# Patient Record
Sex: Male | Born: 1945 | Race: White | Hispanic: No | Smoking: Former smoker
Health system: Southern US, Community
[De-identification: ages and names within clinical notes are randomized; demographics above are authoritative.]

## PROBLEM LIST (undated history)

## (undated) DIAGNOSIS — I10 Essential (primary) hypertension: Secondary | ICD-10-CM

## (undated) DIAGNOSIS — Z7901 Long term (current) use of anticoagulants: Secondary | ICD-10-CM

## (undated) DIAGNOSIS — N289 Disorder of kidney and ureter, unspecified: Secondary | ICD-10-CM

## (undated) DIAGNOSIS — C801 Malignant (primary) neoplasm, unspecified: Secondary | ICD-10-CM

## (undated) HISTORY — PX: OTHER SURGICAL HISTORY: SHX169

## (undated) HISTORY — PX: HIP SURGERY: SHX245

---

## 2015-04-23 ENCOUNTER — Emergency Department (HOSPITAL_COMMUNITY): Payer: Medicare Other | Admitting: Anesthesiology

## 2015-04-23 ENCOUNTER — Encounter (HOSPITAL_COMMUNITY): Payer: Self-pay | Admitting: Emergency Medicine

## 2015-04-23 ENCOUNTER — Emergency Department (HOSPITAL_COMMUNITY): Payer: Medicare Other

## 2015-04-23 ENCOUNTER — Inpatient Hospital Stay (HOSPITAL_COMMUNITY)
Admission: EM | Admit: 2015-04-23 | Discharge: 2015-05-24 | DRG: 025 | Disposition: E | Payer: Medicare Other | Attending: General Surgery | Admitting: General Surgery

## 2015-04-23 ENCOUNTER — Encounter (HOSPITAL_COMMUNITY): Admission: EM | Disposition: E | Payer: Self-pay | Source: Home / Self Care

## 2015-04-23 DIAGNOSIS — Z515 Encounter for palliative care: Secondary | ICD-10-CM | POA: Diagnosis present

## 2015-04-23 DIAGNOSIS — G935 Compression of brain: Secondary | ICD-10-CM | POA: Diagnosis present

## 2015-04-23 DIAGNOSIS — Z791 Long term (current) use of non-steroidal anti-inflammatories (NSAID): Secondary | ICD-10-CM

## 2015-04-23 DIAGNOSIS — S0292XA Unspecified fracture of facial bones, initial encounter for closed fracture: Secondary | ICD-10-CM | POA: Diagnosis present

## 2015-04-23 DIAGNOSIS — G936 Cerebral edema: Secondary | ICD-10-CM | POA: Diagnosis not present

## 2015-04-23 DIAGNOSIS — R402122 Coma scale, eyes open, to pain, at arrival to emergency department: Secondary | ICD-10-CM | POA: Diagnosis present

## 2015-04-23 DIAGNOSIS — S0281XA Fracture of other specified skull and facial bones, right side, initial encounter for closed fracture: Secondary | ICD-10-CM | POA: Diagnosis present

## 2015-04-23 DIAGNOSIS — S065X3A Traumatic subdural hemorrhage with loss of consciousness of 1 hour to 5 hours 59 minutes, initial encounter: Principal | ICD-10-CM | POA: Diagnosis present

## 2015-04-23 DIAGNOSIS — S2232XA Fracture of one rib, left side, initial encounter for closed fracture: Secondary | ICD-10-CM | POA: Diagnosis present

## 2015-04-23 DIAGNOSIS — I4891 Unspecified atrial fibrillation: Secondary | ICD-10-CM | POA: Diagnosis not present

## 2015-04-23 DIAGNOSIS — Z4659 Encounter for fitting and adjustment of other gastrointestinal appliance and device: Secondary | ICD-10-CM

## 2015-04-23 DIAGNOSIS — R402342 Coma scale, best motor response, flexion withdrawal, at arrival to emergency department: Secondary | ICD-10-CM | POA: Diagnosis present

## 2015-04-23 DIAGNOSIS — Z87891 Personal history of nicotine dependence: Secondary | ICD-10-CM

## 2015-04-23 DIAGNOSIS — S066X9A Traumatic subarachnoid hemorrhage with loss of consciousness of unspecified duration, initial encounter: Secondary | ICD-10-CM | POA: Diagnosis present

## 2015-04-23 DIAGNOSIS — I2510 Atherosclerotic heart disease of native coronary artery without angina pectoris: Secondary | ICD-10-CM | POA: Diagnosis present

## 2015-04-23 DIAGNOSIS — D62 Acute posthemorrhagic anemia: Secondary | ICD-10-CM | POA: Diagnosis not present

## 2015-04-23 DIAGNOSIS — W1789XA Other fall from one level to another, initial encounter: Secondary | ICD-10-CM | POA: Diagnosis present

## 2015-04-23 DIAGNOSIS — R402222 Coma scale, best verbal response, incomprehensible words, at arrival to emergency department: Secondary | ICD-10-CM | POA: Diagnosis present

## 2015-04-23 DIAGNOSIS — E873 Alkalosis: Secondary | ICD-10-CM | POA: Diagnosis present

## 2015-04-23 DIAGNOSIS — J9690 Respiratory failure, unspecified, unspecified whether with hypoxia or hypercapnia: Secondary | ICD-10-CM

## 2015-04-23 DIAGNOSIS — W19XXXA Unspecified fall, initial encounter: Secondary | ICD-10-CM | POA: Diagnosis present

## 2015-04-23 DIAGNOSIS — J9600 Acute respiratory failure, unspecified whether with hypoxia or hypercapnia: Secondary | ICD-10-CM | POA: Diagnosis present

## 2015-04-23 DIAGNOSIS — S0240CA Maxillary fracture, right side, initial encounter for closed fracture: Secondary | ICD-10-CM | POA: Diagnosis present

## 2015-04-23 DIAGNOSIS — Z8546 Personal history of malignant neoplasm of prostate: Secondary | ICD-10-CM

## 2015-04-23 DIAGNOSIS — S065XAA Traumatic subdural hemorrhage with loss of consciousness status unknown, initial encounter: Secondary | ICD-10-CM | POA: Diagnosis present

## 2015-04-23 DIAGNOSIS — Z955 Presence of coronary angioplasty implant and graft: Secondary | ICD-10-CM

## 2015-04-23 DIAGNOSIS — I10 Essential (primary) hypertension: Secondary | ICD-10-CM | POA: Diagnosis present

## 2015-04-23 DIAGNOSIS — T1490XA Injury, unspecified, initial encounter: Secondary | ICD-10-CM

## 2015-04-23 DIAGNOSIS — S299XXA Unspecified injury of thorax, initial encounter: Secondary | ICD-10-CM

## 2015-04-23 DIAGNOSIS — Z66 Do not resuscitate: Secondary | ICD-10-CM | POA: Diagnosis present

## 2015-04-23 DIAGNOSIS — J189 Pneumonia, unspecified organism: Secondary | ICD-10-CM

## 2015-04-23 DIAGNOSIS — I6200 Nontraumatic subdural hemorrhage, unspecified: Secondary | ICD-10-CM | POA: Diagnosis not present

## 2015-04-23 DIAGNOSIS — S0240EA Zygomatic fracture, right side, initial encounter for closed fracture: Secondary | ICD-10-CM | POA: Diagnosis present

## 2015-04-23 DIAGNOSIS — T1490 Injury, unspecified: Secondary | ICD-10-CM | POA: Diagnosis present

## 2015-04-23 DIAGNOSIS — S065X9A Traumatic subdural hemorrhage with loss of consciousness of unspecified duration, initial encounter: Secondary | ICD-10-CM | POA: Diagnosis present

## 2015-04-23 DIAGNOSIS — S066XAA Traumatic subarachnoid hemorrhage with loss of consciousness status unknown, initial encounter: Secondary | ICD-10-CM | POA: Diagnosis present

## 2015-04-23 HISTORY — DX: Malignant (primary) neoplasm, unspecified: C80.1

## 2015-04-23 HISTORY — PX: CRANIOTOMY: SHX93

## 2015-04-23 HISTORY — DX: Disorder of kidney and ureter, unspecified: N28.9

## 2015-04-23 HISTORY — DX: Long term (current) use of anticoagulants: Z79.01

## 2015-04-23 HISTORY — DX: Essential (primary) hypertension: I10

## 2015-04-23 LAB — PROTIME-INR
INR: 1.08 (ref 0.00–1.49)
INR: 1.15 (ref 0.00–1.49)
Prothrombin Time: 14.2 seconds (ref 11.6–15.2)
Prothrombin Time: 14.9 seconds (ref 11.6–15.2)

## 2015-04-23 LAB — POCT I-STAT 3, ART BLOOD GAS (G3+)
Acid-Base Excess: 3 mmol/L — ABNORMAL HIGH (ref 0.0–2.0)
BICARBONATE: 26.2 meq/L — AB (ref 20.0–24.0)
O2 SAT: 99 %
TCO2: 27 mmol/L (ref 0–100)
pCO2 arterial: 35.9 mmHg (ref 35.0–45.0)
pH, Arterial: 7.47 — ABNORMAL HIGH (ref 7.350–7.450)
pO2, Arterial: 150 mmHg — ABNORMAL HIGH (ref 80.0–100.0)

## 2015-04-23 LAB — PREPARE FRESH FROZEN PLASMA
UNIT DIVISION: 0
Unit division: 0

## 2015-04-23 LAB — COMPREHENSIVE METABOLIC PANEL
ALK PHOS: 66 U/L (ref 38–126)
ALT: 11 U/L — AB (ref 17–63)
AST: 39 U/L (ref 15–41)
Albumin: 4 g/dL (ref 3.5–5.0)
Anion gap: 9 (ref 5–15)
BUN: 26 mg/dL — AB (ref 6–20)
CALCIUM: 8.9 mg/dL (ref 8.9–10.3)
CHLORIDE: 100 mmol/L — AB (ref 101–111)
CO2: 29 mmol/L (ref 22–32)
CREATININE: 1.89 mg/dL — AB (ref 0.61–1.24)
GFR, EST AFRICAN AMERICAN: 40 mL/min — AB (ref >60)
GFR, EST NON AFRICAN AMERICAN: 34 mL/min — AB (ref >60)
Glucose, Bld: 110 mg/dL — ABNORMAL HIGH (ref 65–99)
Potassium: 5.5 mmol/L — ABNORMAL HIGH (ref 3.5–5.1)
Sodium: 138 mmol/L (ref 135–145)
Total Bilirubin: 1.8 mg/dL — ABNORMAL HIGH (ref 0.3–1.2)
Total Protein: 6.4 g/dL — ABNORMAL LOW (ref 6.5–8.1)

## 2015-04-23 LAB — CBC
HCT: 43.3 % (ref 39.0–52.0)
HEMATOCRIT: 34.7 % — AB (ref 39.0–52.0)
HEMOGLOBIN: 11.3 g/dL — AB (ref 13.0–17.0)
Hemoglobin: 14 g/dL (ref 13.0–17.0)
MCH: 28.3 pg (ref 26.0–34.0)
MCH: 28 pg (ref 26.0–34.0)
MCHC: 32.3 g/dL (ref 30.0–36.0)
MCHC: 32.6 g/dL (ref 30.0–36.0)
MCV: 87.5 fL (ref 78.0–100.0)
MCV: 86.1 fL (ref 78.0–100.0)
PLATELETS: 218 10*3/uL (ref 150–400)
Platelets: 162 10*3/uL (ref 150–400)
RBC: 4.95 MIL/uL (ref 4.22–5.81)
RBC: 4.03 MIL/uL — AB (ref 4.22–5.81)
RDW: 15.1 % (ref 11.5–15.5)
RDW: 14.7 % (ref 11.5–15.5)
WBC: 11.8 10*3/uL — AB (ref 4.0–10.5)
WBC: 12.9 10*3/uL — AB (ref 4.0–10.5)

## 2015-04-23 LAB — POCT I-STAT 7, (LYTES, BLD GAS, ICA,H+H)
Acid-Base Excess: 3 mmol/L — ABNORMAL HIGH (ref 0.0–2.0)
BICARBONATE: 26.8 meq/L — AB (ref 20.0–24.0)
CALCIUM ION: 0.99 mmol/L — AB (ref 1.13–1.30)
HEMATOCRIT: 36 % — AB (ref 39.0–52.0)
Hemoglobin: 12.2 g/dL — ABNORMAL LOW (ref 13.0–17.0)
O2 SAT: 100 %
PH ART: 7.48 — AB (ref 7.350–7.450)
POTASSIUM: 3.1 mmol/L — AB (ref 3.5–5.1)
SODIUM: 139 mmol/L (ref 135–145)
TCO2: 28 mmol/L (ref 0–100)
pCO2 arterial: 36.1 mmHg (ref 35.0–45.0)
pO2, Arterial: 493 mmHg — ABNORMAL HIGH (ref 80.0–100.0)

## 2015-04-23 LAB — BASIC METABOLIC PANEL
ANION GAP: 12 (ref 5–15)
BUN: 24 mg/dL — ABNORMAL HIGH (ref 6–20)
CHLORIDE: 100 mmol/L — AB (ref 101–111)
CO2: 23 mmol/L (ref 22–32)
Calcium: 8.1 mg/dL — ABNORMAL LOW (ref 8.9–10.3)
Creatinine, Ser: 1.56 mg/dL — ABNORMAL HIGH (ref 0.61–1.24)
GFR calc non Af Amer: 43 mL/min — ABNORMAL LOW (ref >60)
GFR, EST AFRICAN AMERICAN: 50 mL/min — AB (ref >60)
GLUCOSE: 181 mg/dL — AB (ref 65–99)
POTASSIUM: 3.4 mmol/L — AB (ref 3.5–5.1)
Sodium: 135 mmol/L (ref 135–145)

## 2015-04-23 LAB — I-STAT CHEM 8, ED
BUN: 42 mg/dL — AB (ref 6–20)
CREATININE: 1.8 mg/dL — AB (ref 0.61–1.24)
Calcium, Ion: 1.06 mmol/L — ABNORMAL LOW (ref 1.13–1.30)
Chloride: 100 mmol/L — ABNORMAL LOW (ref 101–111)
GLUCOSE: 110 mg/dL — AB (ref 65–99)
HEMATOCRIT: 47 % (ref 39.0–52.0)
Hemoglobin: 16 g/dL (ref 13.0–17.0)
POTASSIUM: 5 mmol/L (ref 3.5–5.1)
Sodium: 139 mmol/L (ref 135–145)
TCO2: 30 mmol/L (ref 0–100)

## 2015-04-23 LAB — ETHANOL

## 2015-04-23 LAB — PREPARE RBC (CROSSMATCH)

## 2015-04-23 LAB — I-STAT ARTERIAL BLOOD GAS, ED
Acid-Base Excess: 6 mmol/L — ABNORMAL HIGH (ref 0.0–2.0)
Bicarbonate: 30.7 mEq/L — ABNORMAL HIGH (ref 20.0–24.0)
O2 SAT: 100 %
PCO2 ART: 44.3 mmHg (ref 35.0–45.0)
PH ART: 7.45 (ref 7.350–7.450)
PO2 ART: 510 mmHg — AB (ref 80.0–100.0)
TCO2: 32 mmol/L (ref 0–100)

## 2015-04-23 LAB — APTT: aPTT: 29 seconds (ref 24–37)

## 2015-04-23 LAB — SAMPLE TO BLOOD BANK

## 2015-04-23 LAB — MRSA PCR SCREENING: MRSA BY PCR: NEGATIVE

## 2015-04-23 LAB — TRIGLYCERIDES: TRIGLYCERIDES: 52 mg/dL (ref <150)

## 2015-04-23 LAB — ABO/RH: ABO/RH(D): O POS

## 2015-04-23 SURGERY — CRANIOTOMY HEMATOMA EVACUATION SUBDURAL
Anesthesia: General | Site: Head | Laterality: Left

## 2015-04-23 MED ORDER — ROCURONIUM BROMIDE 50 MG/5ML IV SOLN
INTRAVENOUS | Status: AC
  Filled 2015-04-23: qty 2

## 2015-04-23 MED ORDER — 0.9 % SODIUM CHLORIDE (POUR BTL) OPTIME
TOPICAL | Status: DC | PRN
  Administered 2015-04-23 (×2): 1000 mL

## 2015-04-23 MED ORDER — POTASSIUM CHLORIDE IN NACL 20-0.9 MEQ/L-% IV SOLN
INTRAVENOUS | Status: DC
  Administered 2015-04-23 – 2015-04-27 (×4): via INTRAVENOUS
  Filled 2015-04-23 (×8): qty 1000

## 2015-04-23 MED ORDER — MANNITOL 25 % IV SOLN
25.0000 g | Freq: Once | INTRAVENOUS | Status: AC
  Administered 2015-04-23: 25 g via INTRAVENOUS
  Filled 2015-04-23: qty 100

## 2015-04-23 MED ORDER — MIDAZOLAM HCL 5 MG/5ML IJ SOLN
INTRAMUSCULAR | Status: AC | PRN
  Administered 2015-04-23: 2 mg via INTRAVENOUS

## 2015-04-23 MED ORDER — PANTOPRAZOLE SODIUM 40 MG IV SOLR
40.0000 mg | Freq: Every day | INTRAVENOUS | Status: DC
  Administered 2015-04-23 – 2015-04-27 (×5): 40 mg via INTRAVENOUS
  Filled 2015-04-23 (×5): qty 40

## 2015-04-23 MED ORDER — FENTANYL CITRATE (PF) 100 MCG/2ML IJ SOLN
INTRAMUSCULAR | Status: DC | PRN
  Administered 2015-04-23 (×3): 50 ug via INTRAVENOUS
  Administered 2015-04-23: 100 ug via INTRAVENOUS
  Administered 2015-04-23: 50 ug via INTRAVENOUS

## 2015-04-23 MED ORDER — ONDANSETRON HCL 4 MG PO TABS: 4.0000 mg | ORAL_TABLET | ORAL | Status: DC | PRN

## 2015-04-23 MED ORDER — PROMETHAZINE HCL 25 MG PO TABS: 12.5000 mg | ORAL_TABLET | ORAL | Status: DC | PRN

## 2015-04-23 MED ORDER — ANTISEPTIC ORAL RINSE SOLUTION (CORINZ)
7.0000 mL | Freq: Four times a day (QID) | OROMUCOSAL | Status: DC
Start: 2015-04-24 — End: 2015-04-23

## 2015-04-23 MED ORDER — FENTANYL CITRATE (PF) 100 MCG/2ML IJ SOLN: 50.0000 ug | Freq: Once | INTRAMUSCULAR | Status: DC

## 2015-04-23 MED ORDER — STERILE WATER FOR INJECTION IJ SOLN
INTRAMUSCULAR | Status: AC
  Filled 2015-04-23: qty 10

## 2015-04-23 MED ORDER — ONDANSETRON HCL 4 MG PO TABS: 4.0000 mg | ORAL_TABLET | Freq: Four times a day (QID) | ORAL | Status: DC | PRN

## 2015-04-23 MED ORDER — HEMOSTATIC AGENTS (NO CHARGE) OPTIME
TOPICAL | Status: DC | PRN
  Administered 2015-04-23: 1 via TOPICAL

## 2015-04-23 MED ORDER — CHLORHEXIDINE GLUCONATE 0.12% ORAL RINSE (MEDLINE KIT)
15.0000 mL | Freq: Two times a day (BID) | OROMUCOSAL | Status: DC
  Administered 2015-04-23: 15 mL via OROMUCOSAL

## 2015-04-23 MED ORDER — PANTOPRAZOLE SODIUM 40 MG IV SOLR: 40.0000 mg | Freq: Every day | INTRAVENOUS | Status: DC

## 2015-04-23 MED ORDER — THROMBIN 20000 UNITS EX SOLR
CUTANEOUS | Status: DC | PRN
  Administered 2015-04-23 (×2): 20 mL via TOPICAL

## 2015-04-23 MED ORDER — PHENYLEPHRINE HCL 10 MG/ML IJ SOLN
10.0000 mg | INTRAMUSCULAR | Status: DC | PRN
Start: 2015-04-23 — End: 2015-04-23
  Administered 2015-04-23: 20 ug/min via INTRAVENOUS

## 2015-04-23 MED ORDER — EPHEDRINE SULFATE 50 MG/ML IJ SOLN
INTRAMUSCULAR | Status: AC
  Filled 2015-04-23: qty 1

## 2015-04-23 MED ORDER — SODIUM CHLORIDE 0.9 % IV SOLN
175.0000 ug/h | INTRAVENOUS | Status: DC
  Administered 2015-04-23: 25 ug/h via INTRAVENOUS
  Administered 2015-04-25 (×2): 100 ug/h via INTRAVENOUS
  Administered 2015-04-27: 50 ug/h via INTRAVENOUS
  Filled 2015-04-23 (×3): qty 50

## 2015-04-23 MED ORDER — CEFAZOLIN SODIUM-DEXTROSE 2-3 GM-% IV SOLR
INTRAVENOUS | Status: AC
  Filled 2015-04-23: qty 50

## 2015-04-23 MED ORDER — PROPOFOL 1000 MG/100ML IV EMUL
INTRAVENOUS | Status: AC
Start: 2015-04-23 — End: 2015-04-23
  Filled 2015-04-23: qty 100

## 2015-04-23 MED ORDER — ALBUMIN HUMAN 5 % IV SOLN
INTRAVENOUS | Status: DC | PRN
  Administered 2015-04-23 (×2): via INTRAVENOUS

## 2015-04-23 MED ORDER — FENTANYL CITRATE (PF) 250 MCG/5ML IJ SOLN
INTRAMUSCULAR | Status: AC
  Filled 2015-04-23: qty 5

## 2015-04-23 MED ORDER — ROCURONIUM BROMIDE 100 MG/10ML IV SOLN
INTRAVENOUS | Status: DC | PRN
  Administered 2015-04-23: 50 mg via INTRAVENOUS
  Administered 2015-04-23: 20 mg via INTRAVENOUS

## 2015-04-23 MED ORDER — PROPOFOL 10 MG/ML IV BOLUS
INTRAVENOUS | Status: AC
  Filled 2015-04-23: qty 20

## 2015-04-23 MED ORDER — HYDROMORPHONE HCL 1 MG/ML IJ SOLN: 0.5000 mg | INTRAMUSCULAR | Status: DC | PRN

## 2015-04-23 MED ORDER — SODIUM CHLORIDE 0.9 % IV SOLN: INTRAVENOUS | Status: DC

## 2015-04-23 MED ORDER — SODIUM CHLORIDE 0.9 % IV SOLN
INTRAVENOUS | Status: DC | PRN
  Administered 2015-04-23: 19:00:00 via INTRAVENOUS

## 2015-04-23 MED ORDER — PROPOFOL 1000 MG/100ML IV EMUL
0.0000 ug/kg/min | INTRAVENOUS | Status: DC
Start: 2015-04-23 — End: 2015-04-27
  Administered 2015-04-23: 10 ug/kg/min via INTRAVENOUS
  Filled 2015-04-23: qty 100

## 2015-04-23 MED ORDER — CHLORHEXIDINE GLUCONATE 0.12% ORAL RINSE (MEDLINE KIT)
15.0000 mL | Freq: Two times a day (BID) | OROMUCOSAL | Status: DC
  Administered 2015-04-24 – 2015-04-27 (×7): 15 mL via OROMUCOSAL

## 2015-04-23 MED ORDER — BISACODYL 10 MG RE SUPP: 10.0000 mg | Freq: Every day | RECTAL | Status: DC | PRN

## 2015-04-23 MED ORDER — MIDAZOLAM HCL 2 MG/2ML IJ SOLN
INTRAMUSCULAR | Status: AC
  Filled 2015-04-23: qty 2

## 2015-04-23 MED ORDER — LABETALOL HCL 5 MG/ML IV SOLN: 10.0000 mg | INTRAVENOUS | Status: DC | PRN

## 2015-04-23 MED ORDER — DEXMEDETOMIDINE HCL IN NACL 200 MCG/50ML IV SOLN
INTRAVENOUS | Status: AC
  Filled 2015-04-23: qty 50

## 2015-04-23 MED ORDER — ANTISEPTIC ORAL RINSE SOLUTION (CORINZ)
7.0000 mL | Freq: Four times a day (QID) | OROMUCOSAL | Status: DC
  Administered 2015-04-24 – 2015-04-27 (×15): 7 mL via OROMUCOSAL

## 2015-04-23 MED ORDER — ONDANSETRON HCL 4 MG/2ML IJ SOLN: 4.0000 mg | INTRAMUSCULAR | Status: DC | PRN

## 2015-04-23 MED ORDER — SODIUM CHLORIDE 0.9 % IV SOLN
500.0000 mg | Freq: Two times a day (BID) | INTRAVENOUS | Status: DC
  Administered 2015-04-23 – 2015-04-27 (×8): 500 mg via INTRAVENOUS
  Filled 2015-04-23 (×10): qty 5

## 2015-04-23 MED ORDER — PANTOPRAZOLE SODIUM 40 MG PO TBEC: 40.0000 mg | DELAYED_RELEASE_TABLET | Freq: Every day | ORAL | Status: DC

## 2015-04-23 MED ORDER — CEFAZOLIN SODIUM-DEXTROSE 2-3 GM-% IV SOLR
2.0000 g | Freq: Three times a day (TID) | INTRAVENOUS | Status: AC
  Administered 2015-04-24 (×2): 2 g via INTRAVENOUS
  Filled 2015-04-23 (×2): qty 50

## 2015-04-23 MED ORDER — PROPOFOL 10 MG/ML IV BOLUS
0.5000 mg/kg | Freq: Once | INTRAVENOUS | Status: AC
  Administered 2015-04-23: 1000 mg via INTRAVENOUS

## 2015-04-23 MED ORDER — SODIUM CHLORIDE 0.9 % IV SOLN
INTRAVENOUS | Status: AC | PRN
  Administered 2015-04-23: 1000 mL via INTRAVENOUS

## 2015-04-23 MED ORDER — THROMBIN 5000 UNITS EX SOLR
OROMUCOSAL | Status: DC | PRN
  Administered 2015-04-23: 5 mL via TOPICAL

## 2015-04-23 MED ORDER — CEFAZOLIN SODIUM-DEXTROSE 2-3 GM-% IV SOLR
2.0000 g | INTRAVENOUS | Status: AC
  Administered 2015-04-23 (×2): 2 g via INTRAVENOUS

## 2015-04-23 MED ORDER — SODIUM CHLORIDE 0.9 % IV SOLN
INTRAVENOUS | Status: AC | PRN
Start: 2015-04-23 — End: 2015-04-23
  Administered 2015-04-23: 1000 mL via INTRAVENOUS

## 2015-04-23 MED ORDER — FENTANYL BOLUS VIA INFUSION
25.0000 ug | INTRAVENOUS | Status: DC | PRN
  Filled 2015-04-23: qty 25

## 2015-04-23 MED ORDER — ONDANSETRON HCL 4 MG/2ML IJ SOLN: 4.0000 mg | Freq: Four times a day (QID) | INTRAMUSCULAR | Status: DC | PRN

## 2015-04-23 MED ORDER — DEXMEDETOMIDINE HCL IN NACL 200 MCG/50ML IV SOLN
INTRAVENOUS | Status: DC | PRN
  Administered 2015-04-23: .2 ug/kg/h via INTRAVENOUS

## 2015-04-23 SURGICAL SUPPLY — 77 items
BAG DECANTER FOR FLEXI CONT (MISCELLANEOUS) ×3 IMPLANT
BANDAGE GAUZE 4  KLING STR (GAUZE/BANDAGES/DRESSINGS) IMPLANT
BIT DRILL WIRE PASS 1.3MM (BIT) ×1 IMPLANT
BLADE SURG 11 STRL SS (BLADE) IMPLANT
BNDG COHESIVE 4X5 TAN NS LF (GAUZE/BANDAGES/DRESSINGS) IMPLANT
BRUSH SCRUB EZ 1% IODOPHOR (MISCELLANEOUS) IMPLANT
BRUSH SCRUB EZ PLAIN DRY (MISCELLANEOUS) ×3 IMPLANT
BUR ACORN 6.0 PRECISION (BURR) ×2 IMPLANT
BUR ACORN 6.0MM PRECISION (BURR) ×1
BUR SPIRAL ROUTER 2.3 (BUR) IMPLANT
BUR SPIRAL ROUTER 2.3MM (BUR)
CANISTER SUCT 3000ML PPV (MISCELLANEOUS) ×3 IMPLANT
CLIP TI MEDIUM 6 (CLIP) IMPLANT
COVER BACK TABLE 60X90IN (DRAPES) ×6 IMPLANT
DERMABOND ADVANCED (GAUZE/BANDAGES/DRESSINGS) ×2
DERMABOND ADVANCED .7 DNX12 (GAUZE/BANDAGES/DRESSINGS) ×1 IMPLANT
DRAIN CHANNEL 10M FLAT 3/4 FLT (DRAIN) IMPLANT
DRAPE MICROSCOPE LEICA (MISCELLANEOUS) IMPLANT
DRAPE NEUROLOGICAL W/INCISE (DRAPES) ×3 IMPLANT
DRAPE SURG 17X23 STRL (DRAPES) IMPLANT
DRAPE WARM FLUID 44X44 (DRAPE) ×3 IMPLANT
DRILL WIRE PASS 1.3MM (BIT) ×3
ELECT CAUTERY BLADE 6.4 (BLADE) ×3 IMPLANT
ELECT REM PT RETURN 9FT ADLT (ELECTROSURGICAL) ×3
ELECTRODE REM PT RTRN 9FT ADLT (ELECTROSURGICAL) ×1 IMPLANT
EVACUATOR SILICONE 100CC (DRAIN) IMPLANT
GAUZE SPONGE 4X4 12PLY STRL (GAUZE/BANDAGES/DRESSINGS) ×6 IMPLANT
GAUZE SPONGE 4X4 16PLY XRAY LF (GAUZE/BANDAGES/DRESSINGS) IMPLANT
GLOVE BIO SURGEON STRL SZ 6.5 (GLOVE) ×2 IMPLANT
GLOVE BIO SURGEON STRL SZ7 (GLOVE) ×3 IMPLANT
GLOVE BIO SURGEON STRL SZ7.5 (GLOVE) ×3 IMPLANT
GLOVE BIO SURGEONS STRL SZ 6.5 (GLOVE) ×1
GLOVE ECLIPSE 6.5 STRL STRAW (GLOVE) ×3 IMPLANT
GLOVE ECLIPSE 8.5 STRL (GLOVE) ×3 IMPLANT
GLOVE ECLIPSE 9.0 STRL (GLOVE) ×3 IMPLANT
GLOVE EXAM NITRILE LRG STRL (GLOVE) IMPLANT
GLOVE EXAM NITRILE MD LF STRL (GLOVE) IMPLANT
GLOVE EXAM NITRILE XL STR (GLOVE) IMPLANT
GLOVE EXAM NITRILE XS STR PU (GLOVE) IMPLANT
GOWN STRL REUS W/ TWL LRG LVL3 (GOWN DISPOSABLE) IMPLANT
GOWN STRL REUS W/ TWL XL LVL3 (GOWN DISPOSABLE) IMPLANT
GOWN STRL REUS W/TWL 2XL LVL3 (GOWN DISPOSABLE) IMPLANT
GOWN STRL REUS W/TWL LRG LVL3 (GOWN DISPOSABLE)
GOWN STRL REUS W/TWL XL LVL3 (GOWN DISPOSABLE)
HEMOSTAT SURGICEL 2X14 (HEMOSTASIS) IMPLANT
KIT BASIN OR (CUSTOM PROCEDURE TRAY) ×3 IMPLANT
KIT ROOM TURNOVER OR (KITS) ×3 IMPLANT
LIQUID BAND (GAUZE/BANDAGES/DRESSINGS) IMPLANT
NEEDLE HYPO 25X1 1.5 SAFETY (NEEDLE) ×3 IMPLANT
NS IRRIG 1000ML POUR BTL (IV SOLUTION) ×3 IMPLANT
PACK CRANIOTOMY (CUSTOM PROCEDURE TRAY) ×3 IMPLANT
PAD ARMBOARD 7.5X6 YLW CONV (MISCELLANEOUS) ×3 IMPLANT
PAD EYE OVAL STERILE LF (GAUZE/BANDAGES/DRESSINGS) IMPLANT
PATTIES SURGICAL .25X.25 (GAUZE/BANDAGES/DRESSINGS) IMPLANT
PATTIES SURGICAL .5 X.5 (GAUZE/BANDAGES/DRESSINGS) IMPLANT
PATTIES SURGICAL .5 X3 (DISPOSABLE) IMPLANT
PATTIES SURGICAL 1X1 (DISPOSABLE) IMPLANT
PIN MAYFIELD SKULL DISP (PIN) IMPLANT
SPECIMEN JAR SMALL (MISCELLANEOUS) IMPLANT
SPONGE LAP 18X18 X RAY DECT (DISPOSABLE) IMPLANT
SPONGE NEURO XRAY DETECT 1X3 (DISPOSABLE) IMPLANT
SPONGE SURGIFOAM ABS GEL 100 (HEMOSTASIS) ×3 IMPLANT
STAPLER SKIN PROX WIDE 3.9 (STAPLE) ×3 IMPLANT
STAPLER VISISTAT 35W (STAPLE) ×3 IMPLANT
STOCKINETTE 6  STRL (DRAPES) ×2
STOCKINETTE 6 STRL (DRAPES) ×1 IMPLANT
STRIP SURGICAL 1/2 X 6 IN (GAUZE/BANDAGES/DRESSINGS) ×3 IMPLANT
SUT NURALON 4 0 TR CR/8 (SUTURE) ×3 IMPLANT
SUT VIC AB 2-0 CT2 18 VCP726D (SUTURE) ×12 IMPLANT
SUT VIC AB 3-0 SH 18 (SUTURE) ×3 IMPLANT
SYR CONTROL 10ML LL (SYRINGE) ×3 IMPLANT
TAPE CLOTH SURG 4X10 WHT LF (GAUZE/BANDAGES/DRESSINGS) ×6 IMPLANT
TOWEL OR 17X24 6PK STRL BLUE (TOWEL DISPOSABLE) ×3 IMPLANT
TOWEL OR 17X26 10 PK STRL BLUE (TOWEL DISPOSABLE) ×3 IMPLANT
TRAY FOLEY W/METER SILVER 14FR (SET/KITS/TRAYS/PACK) IMPLANT
UNDERPAD 30X30 INCONTINENT (UNDERPADS AND DIAPERS) IMPLANT
WATER STERILE IRR 1000ML POUR (IV SOLUTION) ×3 IMPLANT

## 2015-04-23 NOTE — Transfer of Care (Signed)
Immediate Anesthesia Transfer of Care Note  Patient: Timothy Oneill  Procedure(s) Performed: Procedure(s) with comments: CRANIECTOMY HEMATOMA EVACUATION SUBDURAL (Left) - Craniectomy for subdural hematoma evacuation. Placement of bone flap in abdomen.  Patient Location: ICU  Anesthesia Type:General  Level of Consciousness: sedated and unresponsive  Airway & Oxygen Therapy: Patient placed on Ventilator (see vital sign flow sheet for setting)  Post-op Assessment: Report given to RN  Post vital signs: Reviewed and stable  Last Vitals:  Filed Vitals:   05/11/2015 2039  BP: 167/90  Pulse: 91  Temp:   Resp: 16    Complications: No apparent anesthesia complications

## 2015-04-23 NOTE — Progress Notes (Signed)
   05/07/2015 1731  Clinical Encounter Type  Visited With Patient;Patient and family together;Family;Health care provider  Visit Type Initial;Code   Chaplain responded to a trauma in the ED, and helped family get to a consultation room, and sat with family during medical updating from doctor. Chaplain support available as needed.   Jeri Lager, Chaplain 05/11/2015 5:32 PM

## 2015-04-23 NOTE — ED Notes (Signed)
Pt transported to CT ?

## 2015-04-23 NOTE — Anesthesia Preprocedure Evaluation (Addendum)
Anesthesia Evaluation  Patient identified by MRN, date of birth, ID band Patient unresponsive    Reviewed: Patient's Chart, lab work & pertinent test results, Unable to perform ROS - Chart review onlyPreop documentation limited or incomplete due to emergent nature of procedure.  Airway Mallampati: Intubated      Comment: In hard C-collar Dental   Pulmonary  Intubated, ventilated L 1st rib fracture   breath sounds clear to auscultation       Cardiovascular + dysrhythmias (?anticoag?) Atrial Fibrillation  Rhythm:Irregular Rate:Normal  ??   Neuro/Psych Large acute left subdural hematoma involving the entire left cerebral convexity. 17 mm right midline shift.  Extensive subarachnoid hemorrhage in the right sylvian fissure and bilateral inferior temporal fossa. Small parenchymal hemorrhage left temporal lobe.    GI/Hepatic ??   Endo/Other    Renal/GU Renal InsufficiencyRenal disease (creat 1.80)     Musculoskeletal   Abdominal (+) + obese,   Peds  Hematology   Anesthesia Other Findings [place] off of back of truck, severe head injury: intubated, large SDH  Reproductive/Obstetrics                         Anesthesia Physical Anesthesia Plan  ASA: V and emergent  Anesthesia Plan: General   Post-op Pain Management:    Induction: Inhalational  Airway Management Planned: Oral ETT  Additional Equipment: Arterial line  Intra-op Plan:   Post-operative Plan: Post-operative intubation/ventilation  Informed Consent:   Only emergency history available  Plan Discussed with: CRNA and Surgeon  Anesthesia Plan Comments: (Plan routine monitors, A-line, GETA (existing ETT) with post op ventilation)        Anesthesia Quick Evaluation

## 2015-04-23 NOTE — Brief Op Note (Signed)
05/12/2015  8:23 PM  PATIENT:  Timothy Oneill  69 y.o. male  PRE-OPERATIVE DIAGNOSIS:  subdural hematoma  POST-OPERATIVE DIAGNOSIS:  subdural hematoma  PROCEDURE:  Procedure(s) with comments: CRANIECTOMY HEMATOMA EVACUATION SUBDURAL (Left) - Craniectomy for subdural hematoma evacuation. Placement of bone flap in abdomen.  SURGEON:  Surgeon(s) and Role:    * Earnie Larsson, MD - Primary  PHYSICIAN ASSISTANT:   ASSISTANTS:    ANESTHESIA:   general  EBL:  Total I/O In: 250 [IV Piggyback:250] Out: 950 [Urine:150; Blood:800]  BLOOD ADMINISTERED:none  DRAINS: (10 mm blake) Jackson-Pratt drain(s) with closed bulb suction in the subdural space   LOCAL MEDICATIONS USED:  NONE  SPECIMEN:  No Specimen  DISPOSITION OF SPECIMEN:  N/A  COUNTS:  YES  TOURNIQUET:  * No tourniquets in log *  DICTATION: .Dragon Dictation  PLAN OF CARE: Admit to inpatient   PATIENT DISPOSITION:  ICU - intubated and critically ill.   Delay start of Pharmacological VTE agent (>24hrs) due to surgical blood loss or risk of bleeding: yes

## 2015-04-23 NOTE — ED Notes (Signed)
Pharmacy called about manitol

## 2015-04-23 NOTE — ED Provider Notes (Signed)
CSN: [id number]     Arrival date & time 05/20/2015  1645 History   First MD Initiated Contact with Patient 05/14/2015 1654     Chief Complaint  Patient presents with  . Trauma    Patient is a 69 y.o. male presenting with trauma. The history is provided by the EMS personnel and medical records. The history is limited by the condition of the patient.  Trauma Mechanism of injury: fall Injury location: head/neck Injury location detail: head Incident location: outdoors Time since incident: 30 minutes Arrived directly from scene: yes   Fall:      Fall occurred: from a vehicle (pt witnessed falling off back of [employer] truck)      Impact surface: concrete  EMS/PTA data:      Ambulatory at scene: yes      Responsiveness at scene: initially GCS 15, reportedly communicative, but GCS declined en route now confused.      Airway interventions: endotracheal intubation      Reason for intubation: airway protection and respiratory support      Breathing interventions: assisted ventilation and oxygen      IV access: established      Fluids administered: normal saline      Cardiac interventions: none      Airway condition since incident: improving      Breathing condition since incident: improving  Relevant PMH:      Pharmacological risk factors:            Anticoagulation therapy (unknown ) and antiplatelet therapy (unknown).    Past Medical History  Diagnosis Date  . Renal disorder   . Hypertension   . Cancer [facility])     prostate  . Anticoagulated     Unknown.  According to document accompanying patient, the box is checked yes and states "take blood thinners"   Past Surgical History  Procedure Laterality Date  . Other surgical history      2 episodes of internal bleeding.   . Hip surgery    . Other surgical history      renal gland surgery   No family history on file. Social History  Substance Use Topics  . Smoking status: Former Smoker    Types: Cigarettes  . Smokeless tobacco:  None  . Alcohol Use: None    Review of Systems  Unable to perform ROS: Patient unresponsive  Skin: Positive for wound.    Allergies  Lisinopril  Home Medications   Prior to Admission medications   Not on File   BP 123/66 mmHg  Pulse 61  Temp(Src) 97.3 F (36.3 C)  Resp 20  Ht 6\' 2"  (1.88 m)  Wt 220 lb (99.791 kg)  BMI 28.23 kg/m2  SpO2 100% Physical Exam  Constitutional: He appears well-developed and well-nourished. No distress.  HENT:  Head: Normocephalic.  R periorbital edema with sparing of tarsal plate, large hematoma/mass over R parietal area of scalp, no skull depressions evident, no midface instability, no nasal septal deviation or hematoma  Eyes: Pupils are equal, round, and reactive to light. Right eye exhibits no discharge. Left eye exhibits no discharge.  Neck: No tracheal deviation present.  No obvious midline stepoff/deformity, C collar in place  Cardiovascular: Normal rate and regular rhythm.   Pulmonary/Chest: Effort normal and breath sounds normal. No respiratory distress.  Abdominal: Soft. He exhibits no distension.  Musculoskeletal: He exhibits no edema.  Extremities atraumatic. No midline thoracic or lumbar stepoffs/deformities   Neurological: GCS eye subscore is 1. GCS  verbal subscore is 2. GCS motor subscore is 3.  Skin: Skin is warm and dry.  Psychiatric: He is noncommunicative.    ED Course  INTUBATION Performed by: Ivin Booty Authorized by: Ivin Booty Consent: The procedure was performed in an emergent situation. Verbal consent not obtained. Patient identity confirmed: arm band and hospital-assigned identification number Time out: Immediately prior to procedure a "time out" was called to verify the correct patient, procedure, equipment, support staff and site/side marked as required. Indications: airway protection Intubation method: direct Patient status: paralyzed (RSI) Preoxygenation: nonrebreather mask Sedatives:  etomidate Paralytic: succinylcholine Laryngoscope size: Mac 3 Tube type: cuffed Number of attempts: 1 Cords visualized: yes Post-procedure assessment: chest rise,  ETCO2 monitor and CO2 detector Breath sounds: equal Cuff inflated: yes Tube secured with: ETT holder Chest x-ray interpreted by me, other physician and radiologist. Chest x-ray findings: endotracheal tube in appropriate position Patient tolerance: Patient tolerated the procedure well with no immediate complications   (including critical care time) Labs Review Labs Reviewed  COMPREHENSIVE METABOLIC PANEL - Abnormal; Notable for the following:    Potassium 5.5 (*)    Chloride 100 (*)    Glucose, Bld 110 (*)    BUN 26 (*)    Creatinine, Ser 1.89 (*)    Total Protein 6.4 (*)    ALT 11 (*)    Total Bilirubin 1.8 (*)    GFR calc non Af Amer 34 (*)    GFR calc Af Amer 40 (*)    All other components within normal limits  CBC - Abnormal; Notable for the following:    WBC 11.8 (*)    All other components within normal limits  CBC - Abnormal; Notable for the following:    WBC 12.9 (*)    RBC 4.03 (*)    Hemoglobin 11.3 (*)    HCT 34.7 (*)    All other components within normal limits  BASIC METABOLIC PANEL - Abnormal; Notable for the following:    Potassium 3.4 (*)    Chloride 100 (*)    Glucose, Bld 181 (*)    BUN 24 (*)    Creatinine, Ser 1.56 (*)    Calcium 8.1 (*)    GFR calc non Af Amer 43 (*)    GFR calc Af Amer 50 (*)    All other components within normal limits  I-STAT CHEM 8, ED - Abnormal; Notable for the following:    Chloride 100 (*)    BUN 42 (*)    Creatinine, Ser 1.80 (*)    Glucose, Bld 110 (*)    Calcium, Ion 1.06 (*)    All other components within normal limits  I-STAT ARTERIAL BLOOD GAS, ED - Abnormal; Notable for the following:    pO2, Arterial 510.0 (*)    Bicarbonate 30.7 (*)    Acid-Base Excess 6.0 (*)    All other components within normal limits  POCT I-STAT 7, (LYTES, BLD GAS,  ICA,H+H) - Abnormal; Notable for the following:    pH, Arterial 7.480 (*)    pO2, Arterial 493.0 (*)    Bicarbonate 26.8 (*)    Acid-Base Excess 3.0 (*)    Potassium 3.1 (*)    Calcium, Ion 0.99 (*)    HCT 36.0 (*)    Hemoglobin 12.2 (*)    All other components within normal limits  POCT I-STAT 3, ART BLOOD GAS (G3+) - Abnormal; Notable for the following:    pH, Arterial 7.470 (*)    pO2, Arterial  150.0 (*)    Bicarbonate 26.2 (*)    Acid-Base Excess 3.0 (*)    All other components within normal limits  MRSA PCR SCREENING  ETHANOL  PROTIME-INR  APTT  PROTIME-INR  CDS SEROLOGY  BLOOD GAS, ARTERIAL  BLOOD GAS, ARTERIAL  TRIGLYCERIDES  BLOOD GAS, ARTERIAL  CBC  BASIC METABOLIC PANEL  SAMPLE TO BLOOD BANK  TYPE AND SCREEN  PREPARE FRESH FROZEN PLASMA  ABO/RH  PREPARE RBC (CROSSMATCH)    Imaging Review Ct Head Wo Contrast  04/28/2015   CLINICAL DATA:  Level 1 trauma, fall from you all tract. Intubated. Orbital bruise.  EXAM: CT HEAD WITHOUT CONTRAST  CT MAXILLOFACIAL WITHOUT CONTRAST  CT CERVICAL SPINE WITHOUT CONTRAST  TECHNIQUE: Multidetector CT imaging of the head, cervical spine, and maxillofacial structures were performed using the standard protocol without intravenous contrast. Multiplanar CT image reconstructions of the cervical spine and maxillofacial structures were also generated.  COMPARISON:  None.  FINDINGS: CT HEAD FINDINGS  There is a large heterogeneously hyperdense left subdural hematoma involving the entire left cerebral convexity, which demonstrates prominent mass effect on the left lateral ventricle. There is mild dilatation of the right lateral ventricle most prominent in the right temporal horn. There is a 17 mm right midline shift.  There is extensive subarachnoid hemorrhage in the right sylvian fissure and bilateral inferior temporal fossa. There is a focal parenchymal hemorrhage in the left temporal lobe (series 7/image 14). No intraventricular hemorrhage is  detected. The basilar cisterns remain at least partially patent.  There is a large right parietal scalp subgaleal hematoma. Mastoid air cells are clear.  CT MAXILLOFACIAL FINDINGS  There is superficial soft tissue swelling in the extraconal orbital soft tissues bilaterally, right greater than left. No intraconal mass or hematoma. Globes appear symmetric and intact. No lens dislocation. There is mucosal thickening in the bilateral ethmoidal air cells. There is a tiny fluid level in the right maxillary sinus. There is a nondisplaced right zygomatic arch fracture. There are nondisplaced fractures of the lateral and inferior right orbital wall. The lateral right orbital wall fracture extends into the anterior lateral right frontal calvarium (series 602/ image 46). There are nondisplaced fractures of the anterior and posterior lateral left maxillary sinus walls. No fracture is seen in the cribriform plate. Nasal bone and nasal septum appear intact. No mandible fracture or mandible dislocation. Left orbital walls appear intact.  CT CERVICAL SPINE FINDINGS  There is a minimally displaced medial posterior left first rib fracture. No fracture is seen in the cervical spine. Marked degenerative disc disease is present in the mid to lower cervical spine, most prominent at C6-7 and C7-T1. Marked facet arthropathy is present throughout the cervical spine bilaterally. No subluxation is seen at the cervical facets. There is 3 mm anterolisthesis at C4-5 and 4 mm anterolisthesis at C5-6, likely degenerative in etiology. No suspicious focal osseous lesion. No prevertebral soft tissue swelling. No gross hematoma in the cervical canal. Status post thyroidectomy. No appreciable cervical lymphadenopathy. Atherosclerotic bilateral carotid artery calcifications at the bifurcations.  Endotracheal tube enters the trachea, with the tip not seen on this image. No pneumothorax is seen at the visualized lung apices. Mild subpleural consolidation  in the dependent apical left upper lobe, which could represent atelectasis or contusion.  IMPRESSION: 1. Large acute left subdural hematoma involving the entire left cerebral convexity. 17 mm right midline shift. 2. Extensive subarachnoid hemorrhage in the right sylvian fissure and bilateral inferior temporal fossa. Small parenchymal hemorrhage in the  left temporal lobe. 3. Compression of the left lateral ventricle with mild dilatation of the temporal horn of the right lateral ventricle. 4. Complex nondisplaced/minimally displaced fractures of the right zygomatic arch, right lateral/inferior orbital wall, right lateral frontal calvarium and left maxillary sinus wall. 5. Bilateral extraconal space orbital contusions with no intraconal pathology. 6. Minimally displaced posterior left first rib fracture. No cervical spine fracture. 7. Mild anterolisthesis at C4-5 and C5-6, favor degenerative etiology.  Critical Value/emergent results were called by telephone at the time of interpretation on 04/25/2015 at 5:49 pm to Dr. Zenovia Jarred , who verbally acknowledged these results.   Electronically Signed   By: Ilona Sorrel M.D.   On: 05/21/2015 18:03   Ct Cervical Spine Wo Contrast  05/20/2015   CLINICAL DATA:  Level 1 trauma, fall from you all tract. Intubated. Orbital bruise.  EXAM: CT HEAD WITHOUT CONTRAST  CT MAXILLOFACIAL WITHOUT CONTRAST  CT CERVICAL SPINE WITHOUT CONTRAST  TECHNIQUE: Multidetector CT imaging of the head, cervical spine, and maxillofacial structures were performed using the standard protocol without intravenous contrast. Multiplanar CT image reconstructions of the cervical spine and maxillofacial structures were also generated.  COMPARISON:  None.  FINDINGS: CT HEAD FINDINGS  There is a large heterogeneously hyperdense left subdural hematoma involving the entire left cerebral convexity, which demonstrates prominent mass effect on the left lateral ventricle. There is mild dilatation of the right  lateral ventricle most prominent in the right temporal horn. There is a 17 mm right midline shift.  There is extensive subarachnoid hemorrhage in the right sylvian fissure and bilateral inferior temporal fossa. There is a focal parenchymal hemorrhage in the left temporal lobe (series 7/image 14). No intraventricular hemorrhage is detected. The basilar cisterns remain at least partially patent.  There is a large right parietal scalp subgaleal hematoma. Mastoid air cells are clear.  CT MAXILLOFACIAL FINDINGS  There is superficial soft tissue swelling in the extraconal orbital soft tissues bilaterally, right greater than left. No intraconal mass or hematoma. Globes appear symmetric and intact. No lens dislocation. There is mucosal thickening in the bilateral ethmoidal air cells. There is a tiny fluid level in the right maxillary sinus. There is a nondisplaced right zygomatic arch fracture. There are nondisplaced fractures of the lateral and inferior right orbital wall. The lateral right orbital wall fracture extends into the anterior lateral right frontal calvarium (series 602/ image 46). There are nondisplaced fractures of the anterior and posterior lateral left maxillary sinus walls. No fracture is seen in the cribriform plate. Nasal bone and nasal septum appear intact. No mandible fracture or mandible dislocation. Left orbital walls appear intact.  CT CERVICAL SPINE FINDINGS  There is a minimally displaced medial posterior left first rib fracture. No fracture is seen in the cervical spine. Marked degenerative disc disease is present in the mid to lower cervical spine, most prominent at C6-7 and C7-T1. Marked facet arthropathy is present throughout the cervical spine bilaterally. No subluxation is seen at the cervical facets. There is 3 mm anterolisthesis at C4-5 and 4 mm anterolisthesis at C5-6, likely degenerative in etiology. No suspicious focal osseous lesion. No prevertebral soft tissue swelling. No gross  hematoma in the cervical canal. Status post thyroidectomy. No appreciable cervical lymphadenopathy. Atherosclerotic bilateral carotid artery calcifications at the bifurcations.  Endotracheal tube enters the trachea, with the tip not seen on this image. No pneumothorax is seen at the visualized lung apices. Mild subpleural consolidation in the dependent apical left upper lobe, which could represent atelectasis  or contusion.  IMPRESSION: 1. Large acute left subdural hematoma involving the entire left cerebral convexity. 17 mm right midline shift. 2. Extensive subarachnoid hemorrhage in the right sylvian fissure and bilateral inferior temporal fossa. Small parenchymal hemorrhage in the left temporal lobe. 3. Compression of the left lateral ventricle with mild dilatation of the temporal horn of the right lateral ventricle. 4. Complex nondisplaced/minimally displaced fractures of the right zygomatic arch, right lateral/inferior orbital wall, right lateral frontal calvarium and left maxillary sinus wall. 5. Bilateral extraconal space orbital contusions with no intraconal pathology. 6. Minimally displaced posterior left first rib fracture. No cervical spine fracture. 7. Mild anterolisthesis at C4-5 and C5-6, favor degenerative etiology.  Critical Value/emergent results were called by telephone at the time of interpretation on 05/14/2015 at 5:49 pm to Dr. Zenovia Jarred , who verbally acknowledged these results.   Electronically Signed   By: Ilona Sorrel M.D.   On: 05/07/2015 18:03   Dg Chest Port 1 View  04/25/2015   CLINICAL DATA:  Fall.  EXAM: PORTABLE CHEST 1 VIEW  COMPARISON:  None.  FINDINGS: Endotracheal tube in good position. Lungs are well aerated and clear. No infiltrate or effusion. Heart size mildly enlarged without heart failure.  IMPRESSION: Endotracheal tube in good position. No acute cardiopulmonary abnormality.   Electronically Signed   By: Franchot Gallo M.D.   On: 04/26/2015 17:07   Ct  Maxillofacial Wo Cm  05/18/2015   CLINICAL DATA:  Level 1 trauma, fall from you all tract. Intubated. Orbital bruise.  EXAM: CT HEAD WITHOUT CONTRAST  CT MAXILLOFACIAL WITHOUT CONTRAST  CT CERVICAL SPINE WITHOUT CONTRAST  TECHNIQUE: Multidetector CT imaging of the head, cervical spine, and maxillofacial structures were performed using the standard protocol without intravenous contrast. Multiplanar CT image reconstructions of the cervical spine and maxillofacial structures were also generated.  COMPARISON:  None.  FINDINGS: CT HEAD FINDINGS  There is a large heterogeneously hyperdense left subdural hematoma involving the entire left cerebral convexity, which demonstrates prominent mass effect on the left lateral ventricle. There is mild dilatation of the right lateral ventricle most prominent in the right temporal horn. There is a 17 mm right midline shift.  There is extensive subarachnoid hemorrhage in the right sylvian fissure and bilateral inferior temporal fossa. There is a focal parenchymal hemorrhage in the left temporal lobe (series 7/image 14). No intraventricular hemorrhage is detected. The basilar cisterns remain at least partially patent.  There is a large right parietal scalp subgaleal hematoma. Mastoid air cells are clear.  CT MAXILLOFACIAL FINDINGS  There is superficial soft tissue swelling in the extraconal orbital soft tissues bilaterally, right greater than left. No intraconal mass or hematoma. Globes appear symmetric and intact. No lens dislocation. There is mucosal thickening in the bilateral ethmoidal air cells. There is a tiny fluid level in the right maxillary sinus. There is a nondisplaced right zygomatic arch fracture. There are nondisplaced fractures of the lateral and inferior right orbital wall. The lateral right orbital wall fracture extends into the anterior lateral right frontal calvarium (series 602/ image 46). There are nondisplaced fractures of the anterior and posterior lateral left  maxillary sinus walls. No fracture is seen in the cribriform plate. Nasal bone and nasal septum appear intact. No mandible fracture or mandible dislocation. Left orbital walls appear intact.  CT CERVICAL SPINE FINDINGS  There is a minimally displaced medial posterior left first rib fracture. No fracture is seen in the cervical spine. Marked degenerative disc disease is present in the  mid to lower cervical spine, most prominent at C6-7 and C7-T1. Marked facet arthropathy is present throughout the cervical spine bilaterally. No subluxation is seen at the cervical facets. There is 3 mm anterolisthesis at C4-5 and 4 mm anterolisthesis at C5-6, likely degenerative in etiology. No suspicious focal osseous lesion. No prevertebral soft tissue swelling. No gross hematoma in the cervical canal. Status post thyroidectomy. No appreciable cervical lymphadenopathy. Atherosclerotic bilateral carotid artery calcifications at the bifurcations.  Endotracheal tube enters the trachea, with the tip not seen on this image. No pneumothorax is seen at the visualized lung apices. Mild subpleural consolidation in the dependent apical left upper lobe, which could represent atelectasis or contusion.  IMPRESSION: 1. Large acute left subdural hematoma involving the entire left cerebral convexity. 17 mm right midline shift. 2. Extensive subarachnoid hemorrhage in the right sylvian fissure and bilateral inferior temporal fossa. Small parenchymal hemorrhage in the left temporal lobe. 3. Compression of the left lateral ventricle with mild dilatation of the temporal horn of the right lateral ventricle. 4. Complex nondisplaced/minimally displaced fractures of the right zygomatic arch, right lateral/inferior orbital wall, right lateral frontal calvarium and left maxillary sinus wall. 5. Bilateral extraconal space orbital contusions with no intraconal pathology. 6. Minimally displaced posterior left first rib fracture. No cervical spine fracture. 7.  Mild anterolisthesis at C4-5 and C5-6, favor degenerative etiology.  Critical Value/emergent results were called by telephone at the time of interpretation on 05/02/2015 at 5:49 pm to Dr. Zenovia Jarred , who verbally acknowledged these results.   Electronically Signed   By: Ilona Sorrel M.D.   On: 05/14/2015 18:03   I have personally reviewed and evaluated these images and lab results as part of my medical decision-making.   EKG Interpretation None      MDM   Final diagnoses:  Trauma  Respiratory failure  Respiratory failure  PNA (pneumonia)  PNA (pneumonia)    69 year old male with history of hypertension, prostate cancer (limited PMSHx available at time of presentation) presenting with altered mental status and declining GCS after witnessed fall off back of [employer] truck. Level I trauma activation. Exam notable for scalp hematoma and periorbital contusion with sparing of tarsal plate. Intubated during primary survey for low GCS and airway protection. Found to have large left subdural hematoma with right midline shift, extensive subarachnoid hemorrhage, focal parenchymal hemorrhage, large right parietal scalp hematoma. Neurosurgery consultation. Taken emergently to OR for further management.  Case discussed with Dr. Thomasene Lot who oversaw management of this patient.    Ivin Booty, MD 05/21/2015 [place], MD 04/24/15 1657

## 2015-04-23 NOTE — ED Notes (Signed)
OR now ready for patient.  Dr. Annette Stable wants patient to go up to OR now.

## 2015-04-23 NOTE — Op Note (Signed)
Date of procedure: 05/02/2015  Date of dictation: Same  Service: Neurosurgery  Preoperative diagnosis: Acute posttraumatic left subdural hematoma  Postoperative diagnosis: Same  Procedure Name: Left decompressive craniectomy with evacuation of subdural hematoma.  Explantation of cranial bone flap into abdominal wall  Surgeon:Rasheen Schewe A.Antonela Freiman, M.D.  Asst. Surgeon: None  Anesthesia: General  Indication: 70 year old male status post fall with large acute left-sided subdural hematoma with marked mass effect and evidence of transtentorial herniation  Operative note: Patient taken the operating room and placed on the operative bed in the supine position. Head turned towards the right. Left scalp prepped and draped sterilely. Curvilinear incision made extending from the zygoma then curving toward the midline of the parietal and frontal bones. Scalp flap elevated. Large decompressive craniectomy performed using high-speed drill. Bone flap elevated. Dura opened. Large acute blood clot encountered under great pressure. This is evacuated with gentle suction and dissection. Area of venous bleeding near the vertex was controlled with bipolar cautery. Significant left posterior inferior temporal contusion was debrided and all bleeding points were controlled. Hemostasis was good. A Blake drain was left in the subdural space after copiously irrigating the subdural space and confirming hemostasis. Dura was placed back down and Gelfoam was placed over the dural opening and craniectomy defect. Scalp reapproximated with 20 Vicryls sutures and staples at the surface.    An incision was made in the right upper quadrant. Dissection was performed down to the deep fascia. Subcutaneous pocket made for bone flap. Bone flap placed. Wound irrigated and then closed in layers. Sterile dressing were applied to both wounds. Patient returns to the intensive care unit in critical condition.

## 2015-04-23 NOTE — ED Notes (Signed)
OR ready for patient

## 2015-04-23 NOTE — ED Notes (Signed)
Returned from [place].  This nurse and RT accompanied patient during transport.

## 2015-04-23 NOTE — Consult Note (Signed)
Timothy Oneill is an 69 y.o. male.   Chief C omplaint: Head injury HPI: 69 year old male status post fall off back of truck today. By report patient was conscious and interactive on arrival to the emergency department. Rapidly declined. Required intubation. CT scan demonstrates large left-sided subdural hematoma with marked mass effect. Medical history notable for cardiac and renal disease. Not currently on anticoagulation. No history of hypotension or hypoxia.  No past medical history on file.  No past surgical history on file.  No family history on file. Social History:  has no tobacco, alcohol, and drug history on file.  Allergies: Allergies not on file   (Not in a hospital admission)  Results for orders placed or performed during the hospital encounter of 05/19/2015 (from the past 48 hour(s))  CBC     Status: Abnormal   Collection Time: 05/09/2015  4:45 PM  Result Value Ref Range   WBC 11.8 (H) 4.0 - 10.5 K/uL   RBC 4.95 4.22 - 5.81 MIL/uL   Hemoglobin 14.0 13.0 - 17.0 g/dL   HCT 43.3 39.0 - 52.0 %   MCV 87.5 78.0 - 100.0 fL   MCH 28.3 26.0 - 34.0 pg   MCHC 32.3 30.0 - 36.0 g/dL   RDW 15.1 11.5 - 15.5 %   Platelets 218 150 - 400 K/uL  Protime-INR     Status: None   Collection Time: 05/02/2015  4:45 PM  Result Value Ref Range   Prothrombin Time 14.2 11.6 - 15.2 seconds   INR 1.08 0.00 - 1.49  Sample to Blood Bank     Status: None   Collection Time: 05/16/2015  4:45 PM  Result Value Ref Range   Blood Bank Specimen SAMPLE AVAILABLE FOR TESTING    Sample Expiration 04/24/2015   APTT     Status: None   Collection Time: 04/24/2015  4:45 PM  Result Value Ref Range   aPTT 29 24 - 37 seconds  Type and screen     Status: None (Preliminary result)   Collection Time: 04/25/2015  4:45 PM  Result Value Ref Range   ABO/RH(D) O POS    Antibody Screen NEG    Sample Expiration 04/26/2015    Unit Number [id number]    Blood Component Type RBC LR PHER2    Unit division 00    Status of  Unit ISSUED    Unit tag comment VERBAL ORDERS PER DR MCKUEN    Transfusion Status OK TO TRANSFUSE    Crossmatch Result PENDING    Unit Number [id number]    Blood Component Type RED CELLS,LR    Unit division 00    Status of Unit ISSUED    Unit tag comment VERBAL ORDERS PER DR MCKUEN    Transfusion Status OK TO TRANSFUSE    Crossmatch Result PENDING   I-Stat Chem 8, ED  (not at [facility])     Status: Abnormal   Collection Time: 05/23/2015  4:57 PM  Result Value Ref Range   Sodium 139 135 - 145 mmol/L   Potassium 5.0 3.5 - 5.1 mmol/L   Chloride 100 (L) 101 - 111 mmol/L   BUN 42 (H) 6 - 20 mg/dL   Creatinine, Ser 1.80 (H) 0.61 - 1.24 mg/dL   Glucose, Bld 110 (H) 65 - 99 mg/dL   Calcium, Ion 1.06 (L) 1.13 - 1.30 mmol/L   TCO2 30 0 - 100 mmol/L   Hemoglobin 16.0 13.0 - 17.0 g/dL   HCT 47.0 39.0 - 52.0 %  Prepare fresh frozen plasma     Status: None (Preliminary result)   Collection Time: 05/22/2015  5:02 PM  Result Value Ref Range   Unit Number [id number]    Blood Component Type LIQ PLASMA    Unit division 00    Status of Unit ISSUED    Unit tag comment VERBAL ORDERS PER DR MCKUEN    Transfusion Status OK TO TRANSFUSE    Unit Number [id number]    Blood Component Type LIQ PLASMA    Unit division 00    Status of Unit ISSUED    Unit tag comment VERBAL ORDERS PER DR MCKUEN    Transfusion Status OK TO TRANSFUSE    Dg Chest Port 1 View  05/23/2015   CLINICAL DATA:  Fall.  EXAM: PORTABLE CHEST 1 VIEW  COMPARISON:  None.  FINDINGS: Endotracheal tube in good position. Lungs are well aerated and clear. No infiltrate or effusion. Heart size mildly enlarged without heart failure.  IMPRESSION: Endotracheal tube in good position. No acute cardiopulmonary abnormality.   Electronically Signed   By: Franchot Gallo M.D.   On: 05/11/2015 17:07    Review of systems not obtained due to patient factors.  Blood pressure 116/65, pulse 120, temperature 97.4 F (36.3 C), resp. rate 16, height 6'  2" (1.88 m), weight 99.791 kg (220 lb), SpO2 100 %.  Patient is intubated and pharmacologically paralyzed. He has pupils which are 4 mm and midposition and sluggishly reactive bilaterally. Gaze is conjugate. Remainder of neurologic exam is not assessable secondary to paralytics. Examination of HEENT and throat demonstrates evidence of significant left-sided periorbital ecchymosis. Examination of the oral first nasopharynx and external auditory canals are clear. Neck with midline airway and normal carotid pulses. No evidence of bony abnormality. Remainder of exam unremarkable. Assessment/Plan Acute large left posttraumatic subdural hematoma. 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 Plan for emergent decompressive craniectomy with evacuation of subdural hematoma. Risks and benefits explained patient's family. They agree to proceed.  Althea Backs A 05/05/2015, 5:41 PM

## 2015-04-23 NOTE — H&P (Signed)
History   Timothy Oneill is an 69 y.o. male.   Chief Complaint: Victim, fall off back of [employer].  Initial GCS 15, dropped to 7 on arrival, intubated in the ED.  Large SDH on CT head.  Given Mannitol and hyperventilated in the ED only Chief Complaint  Patient presents with  . Trauma    Trauma Mechanism of injury: fall Injury location: head/neck Injury location detail: head Incident location: home Time since incident: 2 hours Arrived directly from scene: yes   Fall:      Fall occurred: from a vehicle (fell about 8 feet from the back of [employer])      Height of fall: 8 feet      Impact surface: concrete      Point of impact: head      Entrapped after fall: no  Protective equipment:       None      Suspicion of alcohol use: no      Suspicion of drug use: no  EMS/PTA data:      Bystander interventions: wound care      Ambulatory at scene: no      Blood loss: minimal      Responsiveness: unresponsive      Loss of consciousness: yes      Loss of consciousness duration: 2 hours      Amnesic to event: yes      Airway interventions: intubated on arrival.      Breathing interventions: none      IV access: established      IO access: none      Fluids administered: normal saline      Cardiac interventions: none      Medications administered: none      Airway condition since incident: worsening      Breathing condition since incident: worsening      Circulation condition since incident: stable      Mental status condition since incident: worsening      Disability condition since incident: worsening  Current symptoms:      Associated symptoms:            Reports loss of consciousness.   Relevant PMH:      Medical risk factors:            CAD and cardiac stents.       Tetanus status: unknown      The patient has not been admitted to the hospital due to injury in the past year, and has not been treated and released from the ED due to injury in the past year.   No past  medical history on file.  No past surgical history on file.  No family history on file. Social History:  has no tobacco, alcohol, and drug history on file.  Allergies  Allergies not on file  Home Medications   (Not in a hospital admission)  Trauma Course   Results for orders placed or performed during the hospital encounter of 04/26/2015 (from the past 48 hour(s))  Comprehensive metabolic panel     Status: Abnormal   Collection Time: 05/16/2015  4:45 PM  Result Value Ref Range   Sodium 138 135 - 145 mmol/L   Potassium 5.5 (H) 3.5 - 5.1 mmol/L   Chloride 100 (L) 101 - 111 mmol/L   CO2 29 22 - 32 mmol/L   Glucose, Bld 110 (H) 65 - 99 mg/dL   BUN 26 (H) 6 - 20 mg/dL   Creatinine,  Ser 1.89 (H) 0.61 - 1.24 mg/dL   Calcium 8.9 8.9 - 10.3 mg/dL   Total Protein 6.4 (L) 6.5 - 8.1 g/dL   Albumin 4.0 3.5 - 5.0 g/dL   AST 39 15 - 41 U/L   ALT 11 (L) 17 - 63 U/L   Alkaline Phosphatase 66 38 - 126 U/L   Total Bilirubin 1.8 (H) 0.3 - 1.2 mg/dL   GFR calc non Af Amer 34 (L) >60 mL/min   GFR calc Af Amer 40 (L) >60 mL/min    Comment: (NOTE) The eGFR has been calculated using the CKD EPI equation. This calculation has not been validated in all clinical situations. eGFR's persistently <60 mL/min signify possible Chronic Kidney Disease.    Anion gap 9 5 - 15  CBC     Status: Abnormal   Collection Time: 05/07/2015  4:45 PM  Result Value Ref Range   WBC 11.8 (H) 4.0 - 10.5 K/uL   RBC 4.95 4.22 - 5.81 MIL/uL   Hemoglobin 14.0 13.0 - 17.0 g/dL   HCT 43.3 39.0 - 52.0 %   MCV 87.5 78.0 - 100.0 fL   MCH 28.3 26.0 - 34.0 pg   MCHC 32.3 30.0 - 36.0 g/dL   RDW 15.1 11.5 - 15.5 %   Platelets 218 150 - 400 K/uL  Ethanol     Status: None   Collection Time: 05/09/2015  4:45 PM  Result Value Ref Range   Alcohol, Ethyl (B) <5 <5 mg/dL    Comment:        LOWEST DETECTABLE LIMIT FOR SERUM ALCOHOL IS 5 mg/dL FOR MEDICAL PURPOSES ONLY   Protime-INR     Status: None   Collection Time: 05/11/2015  4:45  PM  Result Value Ref Range   Prothrombin Time 14.2 11.6 - 15.2 seconds   INR 1.08 0.00 - 1.49  Sample to Blood Bank     Status: None   Collection Time: 05/23/2015  4:45 PM  Result Value Ref Range   Blood Bank Specimen SAMPLE AVAILABLE FOR TESTING    Sample Expiration 04/24/2015   APTT     Status: None   Collection Time: 05/20/2015  4:45 PM  Result Value Ref Range   aPTT 29 24 - 37 seconds  Type and screen     Status: None (Preliminary result)   Collection Time: 05/06/2015  4:45 PM  Result Value Ref Range   ABO/RH(D) O POS    Antibody Screen NEG    Sample Expiration 04/26/2015    Unit Number [id number]    Blood Component Type RBC LR PHER2    Unit division 00    Status of Unit ISSUED    Unit tag comment VERBAL ORDERS PER DR MCKUEN    Transfusion Status OK TO TRANSFUSE    Crossmatch Result PENDING    Unit Number [id number]    Blood Component Type RED CELLS,LR    Unit division 00    Status of Unit ISSUED    Unit tag comment VERBAL ORDERS PER DR MCKUEN    Transfusion Status OK TO TRANSFUSE    Crossmatch Result PENDING   ABO/Rh     Status: None   Collection Time: 04/27/2015  4:45 PM  Result Value Ref Range   ABO/RH(D) O POS   I-Stat Chem 8, ED  (not at [facility], [facility])     Status: Abnormal   Collection Time: 05/16/2015  4:57 PM  Result Value Ref Range   Sodium 139 135 - 145 mmol/L     Potassium 5.0 3.5 - 5.1 mmol/L   Chloride 100 (L) 101 - 111 mmol/L   BUN 42 (H) 6 - 20 mg/dL   Creatinine, Ser 1.80 (H) 0.61 - 1.24 mg/dL   Glucose, Bld 110 (H) 65 - 99 mg/dL   Calcium, Ion 1.06 (L) 1.13 - 1.30 mmol/L   TCO2 30 0 - 100 mmol/L   Hemoglobin 16.0 13.0 - 17.0 g/dL   HCT 47.0 39.0 - 52.0 %  Prepare fresh frozen plasma     Status: None (Preliminary result)   Collection Time: 05/18/2015  5:02 PM  Result Value Ref Range   Unit Number [id number]    Blood Component Type LIQ PLASMA    Unit division 00    Status of Unit ISSUED    Unit tag comment VERBAL ORDERS PER DR MCKUEN    Transfusion  Status OK TO TRANSFUSE    Unit Number [id number]    Blood Component Type LIQ PLASMA    Unit division 00    Status of Unit ISSUED    Unit tag comment VERBAL ORDERS PER DR MCKUEN    Transfusion Status OK TO TRANSFUSE   I-Stat arterial blood gas, ED     Status: Abnormal   Collection Time: 04/24/2015  6:01 PM  Result Value Ref Range   pH, Arterial 7.450 7.350 - 7.450   pCO2 arterial 44.3 35.0 - 45.0 mmHg   pO2, Arterial 510.0 (H) 80.0 - 100.0 mmHg   Bicarbonate 30.7 (H) 20.0 - 24.0 mEq/L   TCO2 32 0 - 100 mmol/L   O2 Saturation 100.0 %   Acid-Base Excess 6.0 (H) 0.0 - 2.0 mmol/L   Patient temperature HIDE    Collection site RADIAL, ALLEN'S TEST ACCEPTABLE    Drawn by RT    Sample type ARTERIAL    Ct Head Wo Contrast  05/13/2015   CLINICAL DATA:  Level 1 trauma, fall from you all tract. Intubated. Orbital bruise.  EXAM: CT HEAD WITHOUT CONTRAST  CT MAXILLOFACIAL WITHOUT CONTRAST  CT CERVICAL SPINE WITHOUT CONTRAST  TECHNIQUE: Multidetector CT imaging of the head, cervical spine, and maxillofacial structures were performed using the standard protocol without intravenous contrast. Multiplanar CT image reconstructions of the cervical spine and maxillofacial structures were also generated.  COMPARISON:  None.  FINDINGS: CT HEAD FINDINGS  There is a large heterogeneously hyperdense left subdural hematoma involving the entire left cerebral convexity, which demonstrates prominent mass effect on the left lateral ventricle. There is mild dilatation of the right lateral ventricle most prominent in the right temporal horn. There is a 17 mm right midline shift.  There is extensive subarachnoid hemorrhage in the right sylvian fissure and bilateral inferior temporal fossa. There is a focal parenchymal hemorrhage in the left temporal lobe (series 7/image 14). No intraventricular hemorrhage is detected. The basilar cisterns remain at least partially patent.  There is a large right parietal scalp subgaleal  hematoma. Mastoid air cells are clear.  CT MAXILLOFACIAL FINDINGS  There is superficial soft tissue swelling in the extraconal orbital soft tissues bilaterally, right greater than left. No intraconal mass or hematoma. Globes appear symmetric and intact. No lens dislocation. There is mucosal thickening in the bilateral ethmoidal air cells. There is a tiny fluid level in the right maxillary sinus. There is a nondisplaced right zygomatic arch fracture. There are nondisplaced fractures of the lateral and inferior right orbital wall. The lateral right orbital wall fracture extends into the anterior lateral right frontal calvarium (series 602/ image 46). There are  nondisplaced fractures of the anterior and posterior lateral left maxillary sinus walls. No fracture is seen in the cribriform plate. Nasal bone and nasal septum appear intact. No mandible fracture or mandible dislocation. Left orbital walls appear intact.  CT CERVICAL SPINE FINDINGS  There is a minimally displaced medial posterior left first rib fracture. No fracture is seen in the cervical spine. Marked degenerative disc disease is present in the mid to lower cervical spine, most prominent at C6-7 and C7-T1. Marked facet arthropathy is present throughout the cervical spine bilaterally. No subluxation is seen at the cervical facets. There is 3 mm anterolisthesis at C4-5 and 4 mm anterolisthesis at C5-6, likely degenerative in etiology. No suspicious focal osseous lesion. No prevertebral soft tissue swelling. No gross hematoma in the cervical canal. Status post thyroidectomy. No appreciable cervical lymphadenopathy. Atherosclerotic bilateral carotid artery calcifications at the bifurcations.  Endotracheal tube enters the trachea, with the tip not seen on this image. No pneumothorax is seen at the visualized lung apices. Mild subpleural consolidation in the dependent apical left upper lobe, which could represent atelectasis or contusion.  IMPRESSION: 1. Large  acute left subdural hematoma involving the entire left cerebral convexity. 17 mm right midline shift. 2. Extensive subarachnoid hemorrhage in the right sylvian fissure and bilateral inferior temporal fossa. Small parenchymal hemorrhage in the left temporal lobe. 3. Compression of the left lateral ventricle with mild dilatation of the temporal horn of the right lateral ventricle. 4. Complex nondisplaced/minimally displaced fractures of the right zygomatic arch, right lateral/inferior orbital wall, right lateral frontal calvarium and left maxillary sinus wall. 5. Bilateral extraconal space orbital contusions with no intraconal pathology. 6. Minimally displaced posterior left first rib fracture. No cervical spine fracture. 7. Mild anterolisthesis at C4-5 and C5-6, favor degenerative etiology.  Critical Value/emergent results were called by telephone at the time of interpretation on 05/20/2015 at 5:49 pm to Dr. Zenovia Jarred , who verbally acknowledged these results.   Electronically Signed   By: Ilona Sorrel M.D.   On: 04/26/2015 18:03   Ct Cervical Spine Wo Contrast  05/18/2015   CLINICAL DATA:  Level 1 trauma, fall from you all tract. Intubated. Orbital bruise.  EXAM: CT HEAD WITHOUT CONTRAST  CT MAXILLOFACIAL WITHOUT CONTRAST  CT CERVICAL SPINE WITHOUT CONTRAST  TECHNIQUE: Multidetector CT imaging of the head, cervical spine, and maxillofacial structures were performed using the standard protocol without intravenous contrast. Multiplanar CT image reconstructions of the cervical spine and maxillofacial structures were also generated.  COMPARISON:  None.  FINDINGS: CT HEAD FINDINGS  There is a large heterogeneously hyperdense left subdural hematoma involving the entire left cerebral convexity, which demonstrates prominent mass effect on the left lateral ventricle. There is mild dilatation of the right lateral ventricle most prominent in the right temporal horn. There is a 17 mm right midline shift.  There is  extensive subarachnoid hemorrhage in the right sylvian fissure and bilateral inferior temporal fossa. There is a focal parenchymal hemorrhage in the left temporal lobe (series 7/image 14). No intraventricular hemorrhage is detected. The basilar cisterns remain at least partially patent.  There is a large right parietal scalp subgaleal hematoma. Mastoid air cells are clear.  CT MAXILLOFACIAL FINDINGS  There is superficial soft tissue swelling in the extraconal orbital soft tissues bilaterally, right greater than left. No intraconal mass or hematoma. Globes appear symmetric and intact. No lens dislocation. There is mucosal thickening in the bilateral ethmoidal air cells. There is a tiny fluid level in the right maxillary sinus.  There is a nondisplaced right zygomatic arch fracture. There are nondisplaced fractures of the lateral and inferior right orbital wall. The lateral right orbital wall fracture extends into the anterior lateral right frontal calvarium (series 602/ image 46). There are nondisplaced fractures of the anterior and posterior lateral left maxillary sinus walls. No fracture is seen in the cribriform plate. Nasal bone and nasal septum appear intact. No mandible fracture or mandible dislocation. Left orbital walls appear intact.  CT CERVICAL SPINE FINDINGS  There is a minimally displaced medial posterior left first rib fracture. No fracture is seen in the cervical spine. Marked degenerative disc disease is present in the mid to lower cervical spine, most prominent at C6-7 and C7-T1. Marked facet arthropathy is present throughout the cervical spine bilaterally. No subluxation is seen at the cervical facets. There is 3 mm anterolisthesis at C4-5 and 4 mm anterolisthesis at C5-6, likely degenerative in etiology. No suspicious focal osseous lesion. No prevertebral soft tissue swelling. No gross hematoma in the cervical canal. Status post thyroidectomy. No appreciable cervical lymphadenopathy. Atherosclerotic  bilateral carotid artery calcifications at the bifurcations.  Endotracheal tube enters the trachea, with the tip not seen on this image. No pneumothorax is seen at the visualized lung apices. Mild subpleural consolidation in the dependent apical left upper lobe, which could represent atelectasis or contusion.  IMPRESSION: 1. Large acute left subdural hematoma involving the entire left cerebral convexity. 17 mm right midline shift. 2. Extensive subarachnoid hemorrhage in the right sylvian fissure and bilateral inferior temporal fossa. Small parenchymal hemorrhage in the left temporal lobe. 3. Compression of the left lateral ventricle with mild dilatation of the temporal horn of the right lateral ventricle. 4. Complex nondisplaced/minimally displaced fractures of the right zygomatic arch, right lateral/inferior orbital wall, right lateral frontal calvarium and left maxillary sinus wall. 5. Bilateral extraconal space orbital contusions with no intraconal pathology. 6. Minimally displaced posterior left first rib fracture. No cervical spine fracture. 7. Mild anterolisthesis at C4-5 and C5-6, favor degenerative etiology.  Critical Value/emergent results were called by telephone at the time of interpretation on 05/12/2015 at 5:49 pm to Dr. COURTENEY MACKUEN , who verbally acknowledged these results.   Electronically Signed   By: Jason A Poff M.D.   On: 05/13/2015 18:03   Dg Chest Port 1 View  05/10/2015   CLINICAL DATA:  Fall.  EXAM: PORTABLE CHEST 1 VIEW  COMPARISON:  None.  FINDINGS: Endotracheal tube in good position. Lungs are well aerated and clear. No infiltrate or effusion. Heart size mildly enlarged without heart failure.  IMPRESSION: Endotracheal tube in good position. No acute cardiopulmonary abnormality.   Electronically Signed   By: Charles  Clark M.D.   On: 05/09/2015 17:07   Ct Maxillofacial Wo Cm  05/11/2015   CLINICAL DATA:  Level 1 trauma, fall from you all tract. Intubated. Orbital bruise.  EXAM: CT  HEAD WITHOUT CONTRAST  CT MAXILLOFACIAL WITHOUT CONTRAST  CT CERVICAL SPINE WITHOUT CONTRAST  TECHNIQUE: Multidetector CT imaging of the head, cervical spine, and maxillofacial structures were performed using the standard protocol without intravenous contrast. Multiplanar CT image reconstructions of the cervical spine and maxillofacial structures were also generated.  COMPARISON:  None.  FINDINGS: CT HEAD FINDINGS  There is a large heterogeneously hyperdense left subdural hematoma involving the entire left cerebral convexity, which demonstrates prominent mass effect on the left lateral ventricle. There is mild dilatation of the right lateral ventricle most prominent in the right temporal horn. There is a 17 mm right midline   shift.  There is extensive subarachnoid hemorrhage in the right sylvian fissure and bilateral inferior temporal fossa. There is a focal parenchymal hemorrhage in the left temporal lobe (series 7/image 14). No intraventricular hemorrhage is detected. The basilar cisterns remain at least partially patent.  There is a large right parietal scalp subgaleal hematoma. Mastoid air cells are clear.  CT MAXILLOFACIAL FINDINGS  There is superficial soft tissue swelling in the extraconal orbital soft tissues bilaterally, right greater than left. No intraconal mass or hematoma. Globes appear symmetric and intact. No lens dislocation. There is mucosal thickening in the bilateral ethmoidal air cells. There is a tiny fluid level in the right maxillary sinus. There is a nondisplaced right zygomatic arch fracture. There are nondisplaced fractures of the lateral and inferior right orbital wall. The lateral right orbital wall fracture extends into the anterior lateral right frontal calvarium (series 602/ image 46). There are nondisplaced fractures of the anterior and posterior lateral left maxillary sinus walls. No fracture is seen in the cribriform plate. Nasal bone and nasal septum appear intact. No mandible  fracture or mandible dislocation. Left orbital walls appear intact.  CT CERVICAL SPINE FINDINGS  There is a minimally displaced medial posterior left first rib fracture. No fracture is seen in the cervical spine. Marked degenerative disc disease is present in the mid to lower cervical spine, most prominent at C6-7 and C7-T1. Marked facet arthropathy is present throughout the cervical spine bilaterally. No subluxation is seen at the cervical facets. There is 3 mm anterolisthesis at C4-5 and 4 mm anterolisthesis at C5-6, likely degenerative in etiology. No suspicious focal osseous lesion. No prevertebral soft tissue swelling. No gross hematoma in the cervical canal. Status post thyroidectomy. No appreciable cervical lymphadenopathy. Atherosclerotic bilateral carotid artery calcifications at the bifurcations.  Endotracheal tube enters the trachea, with the tip not seen on this image. No pneumothorax is seen at the visualized lung apices. Mild subpleural consolidation in the dependent apical left upper lobe, which could represent atelectasis or contusion.  IMPRESSION: 1. Large acute left subdural hematoma involving the entire left cerebral convexity. 17 mm right midline shift. 2. Extensive subarachnoid hemorrhage in the right sylvian fissure and bilateral inferior temporal fossa. Small parenchymal hemorrhage in the left temporal lobe. 3. Compression of the left lateral ventricle with mild dilatation of the temporal horn of the right lateral ventricle. 4. Complex nondisplaced/minimally displaced fractures of the right zygomatic arch, right lateral/inferior orbital wall, right lateral frontal calvarium and left maxillary sinus wall. 5. Bilateral extraconal space orbital contusions with no intraconal pathology. 6. Minimally displaced posterior left first rib fracture. No cervical spine fracture. 7. Mild anterolisthesis at C4-5 and C5-6, favor degenerative etiology.  Critical Value/emergent results were called by telephone  at the time of interpretation on 04/24/2015 at 5:49 pm to Dr. COURTENEY MACKUEN , who verbally acknowledged these results.   Electronically Signed   By: Jason A Poff M.D.   On: 05/03/2015 18:03    Review of Systems  Neurological: Positive for loss of consciousness.    Blood pressure 84/47, pulse 125, temperature 97 F (36.1 C), resp. rate 23, height 6' 2" (1.88 m), weight 99.791 kg (220 lb), SpO2 100 %. Physical Exam  Constitutional: He appears well-developed and well-nourished.  HENT:  Head:    Eyes: Right eye exhibits abnormal extraocular motion. Right pupil is not round and not reactive.  Cardiovascular: Normal rate, regular rhythm and normal heart sounds.   Respiratory: Effort normal and breath sounds normal.  Patient intubated on   my arrival   GI: Soft. Bowel sounds are normal.  Genitourinary: Penis normal.  Neurological: He is unresponsive. GCS eye subscore is 1. GCS verbal subscore is 1. GCS motor subscore is 1.  Intubated on my arrival  Skin: Skin is warm and dry.     Assessment/Plan Fall Large Left sided counter-coup SDH Dr. Pool here for evaluation and to the OR  No other injuries noted, but patient only got CT of the head as he fell directly on his head  JAMES WYATT 05/14/2015, 6:30 PM   Procedures  

## 2015-04-23 NOTE — ED Notes (Signed)
20 etomidate pushed by janie 150 succ same Intubation by Johny Drilling, MD (resident

## 2015-04-24 ENCOUNTER — Inpatient Hospital Stay (HOSPITAL_COMMUNITY): Payer: Medicare Other

## 2015-04-24 LAB — BASIC METABOLIC PANEL
Anion gap: 11 (ref 5–15)
BUN: 22 mg/dL — ABNORMAL HIGH (ref 6–20)
CALCIUM: 8.3 mg/dL — AB (ref 8.9–10.3)
CO2: 24 mmol/L (ref 22–32)
CREATININE: 1.28 mg/dL — AB (ref 0.61–1.24)
Chloride: 104 mmol/L (ref 101–111)
GFR calc non Af Amer: 55 mL/min — ABNORMAL LOW (ref >60)
GLUCOSE: 130 mg/dL — AB (ref 65–99)
Potassium: 3.7 mmol/L (ref 3.5–5.1)
Sodium: 139 mmol/L (ref 135–145)

## 2015-04-24 LAB — CBC
HCT: 32.8 % — ABNORMAL LOW (ref 39.0–52.0)
HEMOGLOBIN: 11 g/dL — AB (ref 13.0–17.0)
MCH: 28.6 pg (ref 26.0–34.0)
MCHC: 33.5 g/dL (ref 30.0–36.0)
MCV: 85.4 fL (ref 78.0–100.0)
PLATELETS: 154 10*3/uL (ref 150–400)
RBC: 3.84 MIL/uL — ABNORMAL LOW (ref 4.22–5.81)
RDW: 14.9 % (ref 11.5–15.5)
WBC: 9.8 10*3/uL (ref 4.0–10.5)

## 2015-04-24 MED ORDER — ACETAMINOPHEN 650 MG RE SUPP
650.0000 mg | RECTAL | Status: DC | PRN
  Administered 2015-04-25 – 2015-04-27 (×2): 650 mg via RECTAL
  Filled 2015-04-24 (×2): qty 1

## 2015-04-24 MED ORDER — METOPROLOL TARTRATE 1 MG/ML IV SOLN
5.0000 mg | Freq: Four times a day (QID) | INTRAVENOUS | Status: DC | PRN
  Administered 2015-04-24 – 2015-04-25 (×3): 5 mg via INTRAVENOUS
  Filled 2015-04-24 (×3): qty 5

## 2015-04-24 NOTE — Progress Notes (Signed)
Postop day 1.  Current temperature 101.1. Blood pressure acceptable. Heart rate stable on beta blockers. Urine output strong. Drain output is diminishing. On exam patient is still sedated and intubated. His pupils are pinpoint and minimally reactive bilaterally. Gaze is conjugate. Corneal reflexes are present bilaterally. Cough and gag reflexes are present. Patient with some abnormal flexion to noxious stimuli bilaterally. Dressing dry.  Follow-up head CT scan demonstrates resolution of large subdural hematoma. Minimal mass effect. Patient does have significant contusion to his left posterior temporal region.  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 Patient remains critically ill. Continue ICU support. Given his medical issues i.e. heart disease and renal failure we are limited in terms of how much we can pushes volume or pressure status. Currently plan is to continue mild to moderate hyperventilation and head elevation. Maintain a normal blood pressure.

## 2015-04-24 NOTE — Progress Notes (Signed)
Patient's right contact lens removed with sterile saline wipe and sterile gloves. Stored in container with normal saline.  Attempt made to remove left contact lens unsuccessful. Contact lens still in place. Will consult MD in the morning.

## 2015-04-24 NOTE — Progress Notes (Signed)
Utilization Review Completed.Timothy Oneill T10/08/2014  

## 2015-04-24 NOTE — Progress Notes (Signed)
1 Day Post-Op  Subjective: Intubated and sedated Hemodynamically stable overnight  Objective: Vital signs in last 24 hours: Temp:  [95.7 F (35.4 C)-101.1 F (38.4 C)] 100.4 F (38 C) (10/02 0700) Pulse Rate:  [52-125] 86 (10/02 0700) Resp:  [8-23] 12 (10/02 0700) BP: (82-167)/(46-91) 127/72 mmHg (10/02 0700) SpO2:  [93 %-100 %] 100 % (10/02 0700) Arterial Line BP: (115-168)/(66-95) 162/84 mmHg (10/02 0700) FiO2 (%):  [40 %-100 %] 40 % (10/02 0400) Weight:  [99.791 kg (220 lb)] 99.791 kg (220 lb) (10/01 1654) Last BM Date:  (pta)  Intake/Output from previous day: 10/01 0701 - 10/02 0700 In: 3380.8 [I.V.:2725.8; IV Piggyback:655] Out: 3870 [Urine:2190; Emesis/NG output:500; Drains:380; Blood:800] Intake/Output this shift:  Lungs clear CV tachy  Lab Results:   Recent Labs  05/04/2015 2130 04/24/15 0215  WBC 12.9* 9.8  HGB 11.3* 11.0*  HCT 34.7* 32.8*  PLT 162 154   BMET  Recent Labs  05/23/2015 2130 04/24/15 0215  NA 135 139  K 3.4* 3.7  CL 100* 104  CO2 23 24  GLUCOSE 181* 130*  BUN 24* 22*  CREATININE 1.56* 1.28*  CALCIUM 8.1* 8.3*   PT/INR  Recent Labs  04/24/2015 1645 05/11/2015 2130  LABPROT 14.2 14.9  INR 1.08 1.15   ABG  Recent Labs  05/19/2015 1947 05/18/2015 2134  PHART 7.480* 7.470*  HCO3 26.8* 26.2*    Studies/Results: Ct Head Without Contrast  04/24/2015   CLINICAL DATA:  Subdural hematoma follow-up.  EXAM: CT HEAD WITHOUT CONTRAST  TECHNIQUE: Contiguous axial images were obtained from the base of the skull through the vertex without intravenous contrast.  COMPARISON:  Head CT from yesterday  FINDINGS: Skull and Sinuses:Decompressive craniectomy on the left with subdural evacuation and drain.  Facial fractures without interval displacement, most notably the right orbit lateral wall and right zygomatic arch.  Orbits: No change from prior.  Brain: Evacuation of the thickest portions of the left subdural hematoma, with residual hemorrhage along  the posterior hemisphere, posterior falx, and left tentorium - measuring up to 8 mm thickness. Blooming of hemorrhagic contusion in the inferior left temporal lobe, both the edema and hematoma components. Rightward midline shift is improved, but there is still transtentorial herniation. Midline shift now measures 12 mm. Subtle low-density in the anterior left frontal region, parasagittal. Small volume and intraventricular hemorrhage, layering in the occipital horn of the right lateral ventricle. Stable asymmetric lateral ventricular volume, greater on the right, likely from left compression rather than right entrapment. Right sylvian fissure subarachnoid hemorrhage is stable.  IMPRESSION: 1. Left decompressive craniectomy and subdural hematoma evacuation. Small residual hematoma is described above. Midline shift is improved, but still notable at 12 mm with subfalcine herniation. 2. Blooming hemorrhagic contusion in the inferior left temporal lobe without progressive local mass effect. 3. Stable subarachnoid and intraventricular hemorrhage. Stable ventricular volume when allowing for decompression. 4. Questionable new low-density in the parasagittal left frontal lobe, which could reflect ACA branch injury with infarct. Attention on follow-up.   Electronically Signed   By: Monte Fantasia M.D.   On: 04/24/2015 01:54   Ct Head Wo Contrast  05/19/2015   CLINICAL DATA:  Level 1 trauma, fall from you all tract. Intubated. Orbital bruise.  EXAM: CT HEAD WITHOUT CONTRAST  CT MAXILLOFACIAL WITHOUT CONTRAST  CT CERVICAL SPINE WITHOUT CONTRAST  TECHNIQUE: Multidetector CT imaging of the head, cervical spine, and maxillofacial structures were performed using the standard protocol without intravenous contrast. Multiplanar CT image reconstructions of the cervical  spine and maxillofacial structures were also generated.  COMPARISON:  None.  FINDINGS: CT HEAD FINDINGS  There is Oneill large heterogeneously hyperdense left subdural  hematoma involving the entire left cerebral convexity, which demonstrates prominent mass effect on the left lateral ventricle. There is mild dilatation of the right lateral ventricle most prominent in the right temporal horn. There is Oneill 17 mm right midline shift.  There is extensive subarachnoid hemorrhage in the right sylvian fissure and bilateral inferior temporal fossa. There is Oneill focal parenchymal hemorrhage in the left temporal lobe (series 7/image 14). No intraventricular hemorrhage is detected. The basilar cisterns remain at least partially patent.  There is Oneill large right parietal scalp subgaleal hematoma. Mastoid air cells are clear.  CT MAXILLOFACIAL FINDINGS  There is superficial soft tissue swelling in the extraconal orbital soft tissues bilaterally, right greater than left. No intraconal mass or hematoma. Globes appear symmetric and intact. No lens dislocation. There is mucosal thickening in the bilateral ethmoidal air cells. There is Oneill tiny fluid level in the right maxillary sinus. There is Oneill nondisplaced right zygomatic arch fracture. There are nondisplaced fractures of the lateral and inferior right orbital wall. The lateral right orbital wall fracture extends into the anterior lateral right frontal calvarium (series 602/ image 46). There are nondisplaced fractures of the anterior and posterior lateral left maxillary sinus walls. No fracture is seen in the cribriform plate. Nasal bone and nasal septum appear intact. No mandible fracture or mandible dislocation. Left orbital walls appear intact.  CT CERVICAL SPINE FINDINGS  There is Oneill minimally displaced medial posterior left first rib fracture. No fracture is seen in the cervical spine. Marked degenerative disc disease is present in the mid to lower cervical spine, most prominent at C6-7 and C7-T1. Marked facet arthropathy is present throughout the cervical spine bilaterally. No subluxation is seen at the cervical facets. There is 3 mm anterolisthesis  at C4-5 and 4 mm anterolisthesis at C5-6, likely degenerative in etiology. No suspicious focal osseous lesion. No prevertebral soft tissue swelling. No gross hematoma in the cervical canal. Status post thyroidectomy. No appreciable cervical lymphadenopathy. Atherosclerotic bilateral carotid artery calcifications at the bifurcations.  Endotracheal tube enters the trachea, with the tip not seen on this image. No pneumothorax is seen at the visualized lung apices. Mild subpleural consolidation in the dependent apical left upper lobe, which could represent atelectasis or contusion.  IMPRESSION: 1. Large acute left subdural hematoma involving the entire left cerebral convexity. 17 mm right midline shift. 2. Extensive subarachnoid hemorrhage in the right sylvian fissure and bilateral inferior temporal fossa. Small parenchymal hemorrhage in the left temporal lobe. 3. Compression of the left lateral ventricle with mild dilatation of the temporal horn of the right lateral ventricle. 4. Complex nondisplaced/minimally displaced fractures of the right zygomatic arch, right lateral/inferior orbital wall, right lateral frontal calvarium and left maxillary sinus wall. 5. Bilateral extraconal space orbital contusions with no intraconal pathology. 6. Minimally displaced posterior left first rib fracture. No cervical spine fracture. 7. Mild anterolisthesis at C4-5 and C5-6, favor degenerative etiology.  Critical Value/emergent results were called by telephone at the time of interpretation on 05/13/2015 at 5:49 pm to Dr. Zenovia Jarred , who verbally acknowledged these results.   Electronically Signed   By: Ilona Sorrel M.D.   On: 04/29/2015 18:03   Ct Cervical Spine Wo Contrast  05/19/2015   CLINICAL DATA:  Level 1 trauma, fall from you all tract. Intubated. Orbital bruise.  EXAM: CT HEAD WITHOUT CONTRAST  CT MAXILLOFACIAL WITHOUT CONTRAST  CT CERVICAL SPINE WITHOUT CONTRAST  TECHNIQUE: Multidetector CT imaging of the head,  cervical spine, and maxillofacial structures were performed using the standard protocol without intravenous contrast. Multiplanar CT image reconstructions of the cervical spine and maxillofacial structures were also generated.  COMPARISON:  None.  FINDINGS: CT HEAD FINDINGS  There is Oneill large heterogeneously hyperdense left subdural hematoma involving the entire left cerebral convexity, which demonstrates prominent mass effect on the left lateral ventricle. There is mild dilatation of the right lateral ventricle most prominent in the right temporal horn. There is Oneill 17 mm right midline shift.  There is extensive subarachnoid hemorrhage in the right sylvian fissure and bilateral inferior temporal fossa. There is Oneill focal parenchymal hemorrhage in the left temporal lobe (series 7/image 14). No intraventricular hemorrhage is detected. The basilar cisterns remain at least partially patent.  There is Oneill large right parietal scalp subgaleal hematoma. Mastoid air cells are clear.  CT MAXILLOFACIAL FINDINGS  There is superficial soft tissue swelling in the extraconal orbital soft tissues bilaterally, right greater than left. No intraconal mass or hematoma. Globes appear symmetric and intact. No lens dislocation. There is mucosal thickening in the bilateral ethmoidal air cells. There is Oneill tiny fluid level in the right maxillary sinus. There is Oneill nondisplaced right zygomatic arch fracture. There are nondisplaced fractures of the lateral and inferior right orbital wall. The lateral right orbital wall fracture extends into the anterior lateral right frontal calvarium (series 602/ image 46). There are nondisplaced fractures of the anterior and posterior lateral left maxillary sinus walls. No fracture is seen in the cribriform plate. Nasal bone and nasal septum appear intact. No mandible fracture or mandible dislocation. Left orbital walls appear intact.  CT CERVICAL SPINE FINDINGS  There is Oneill minimally displaced medial posterior  left first rib fracture. No fracture is seen in the cervical spine. Marked degenerative disc disease is present in the mid to lower cervical spine, most prominent at C6-7 and C7-T1. Marked facet arthropathy is present throughout the cervical spine bilaterally. No subluxation is seen at the cervical facets. There is 3 mm anterolisthesis at C4-5 and 4 mm anterolisthesis at C5-6, likely degenerative in etiology. No suspicious focal osseous lesion. No prevertebral soft tissue swelling. No gross hematoma in the cervical canal. Status post thyroidectomy. No appreciable cervical lymphadenopathy. Atherosclerotic bilateral carotid artery calcifications at the bifurcations.  Endotracheal tube enters the trachea, with the tip not seen on this image. No pneumothorax is seen at the visualized lung apices. Mild subpleural consolidation in the dependent apical left upper lobe, which could represent atelectasis or contusion.  IMPRESSION: 1. Large acute left subdural hematoma involving the entire left cerebral convexity. 17 mm right midline shift. 2. Extensive subarachnoid hemorrhage in the right sylvian fissure and bilateral inferior temporal fossa. Small parenchymal hemorrhage in the left temporal lobe. 3. Compression of the left lateral ventricle with mild dilatation of the temporal horn of the right lateral ventricle. 4. Complex nondisplaced/minimally displaced fractures of the right zygomatic arch, right lateral/inferior orbital wall, right lateral frontal calvarium and left maxillary sinus wall. 5. Bilateral extraconal space orbital contusions with no intraconal pathology. 6. Minimally displaced posterior left first rib fracture. No cervical spine fracture. 7. Mild anterolisthesis at C4-5 and C5-6, favor degenerative etiology.  Critical Value/emergent results were called by telephone at the time of interpretation on 04/25/2015 at 5:49 pm to Dr. Zenovia Jarred , who verbally acknowledged these results.   Electronically Signed    By:  Ilona Sorrel M.D.   On: 05/20/2015 18:03   Dg Chest Port 1 View  04/26/2015   CLINICAL DATA:  Fall.  EXAM: PORTABLE CHEST 1 VIEW  COMPARISON:  None.  FINDINGS: Endotracheal tube in good position. Lungs are well aerated and clear. No infiltrate or effusion. Heart size mildly enlarged without heart failure.  IMPRESSION: Endotracheal tube in good position. No acute cardiopulmonary abnormality.   Electronically Signed   By: Franchot Gallo M.D.   On: 04/28/2015 17:07   Ct Maxillofacial Wo Cm  05/14/2015   CLINICAL DATA:  Level 1 trauma, fall from you all tract. Intubated. Orbital bruise.  EXAM: CT HEAD WITHOUT CONTRAST  CT MAXILLOFACIAL WITHOUT CONTRAST  CT CERVICAL SPINE WITHOUT CONTRAST  TECHNIQUE: Multidetector CT imaging of the head, cervical spine, and maxillofacial structures were performed using the standard protocol without intravenous contrast. Multiplanar CT image reconstructions of the cervical spine and maxillofacial structures were also generated.  COMPARISON:  None.  FINDINGS: CT HEAD FINDINGS  There is Oneill large heterogeneously hyperdense left subdural hematoma involving the entire left cerebral convexity, which demonstrates prominent mass effect on the left lateral ventricle. There is mild dilatation of the right lateral ventricle most prominent in the right temporal horn. There is Oneill 17 mm right midline shift.  There is extensive subarachnoid hemorrhage in the right sylvian fissure and bilateral inferior temporal fossa. There is Oneill focal parenchymal hemorrhage in the left temporal lobe (series 7/image 14). No intraventricular hemorrhage is detected. The basilar cisterns remain at least partially patent.  There is Oneill large right parietal scalp subgaleal hematoma. Mastoid air cells are clear.  CT MAXILLOFACIAL FINDINGS  There is superficial soft tissue swelling in the extraconal orbital soft tissues bilaterally, right greater than left. No intraconal mass or hematoma. Globes appear symmetric and  intact. No lens dislocation. There is mucosal thickening in the bilateral ethmoidal air cells. There is Oneill tiny fluid level in the right maxillary sinus. There is Oneill nondisplaced right zygomatic arch fracture. There are nondisplaced fractures of the lateral and inferior right orbital wall. The lateral right orbital wall fracture extends into the anterior lateral right frontal calvarium (series 602/ image 46). There are nondisplaced fractures of the anterior and posterior lateral left maxillary sinus walls. No fracture is seen in the cribriform plate. Nasal bone and nasal septum appear intact. No mandible fracture or mandible dislocation. Left orbital walls appear intact.  CT CERVICAL SPINE FINDINGS  There is Oneill minimally displaced medial posterior left first rib fracture. No fracture is seen in the cervical spine. Marked degenerative disc disease is present in the mid to lower cervical spine, most prominent at C6-7 and C7-T1. Marked facet arthropathy is present throughout the cervical spine bilaterally. No subluxation is seen at the cervical facets. There is 3 mm anterolisthesis at C4-5 and 4 mm anterolisthesis at C5-6, likely degenerative in etiology. No suspicious focal osseous lesion. No prevertebral soft tissue swelling. No gross hematoma in the cervical canal. Status post thyroidectomy. No appreciable cervical lymphadenopathy. Atherosclerotic bilateral carotid artery calcifications at the bifurcations.  Endotracheal tube enters the trachea, with the tip not seen on this image. No pneumothorax is seen at the visualized lung apices. Mild subpleural consolidation in the dependent apical left upper lobe, which could represent atelectasis or contusion.  IMPRESSION: 1. Large acute left subdural hematoma involving the entire left cerebral convexity. 17 mm right midline shift. 2. Extensive subarachnoid hemorrhage in the right sylvian fissure and bilateral inferior temporal fossa. Small parenchymal hemorrhage  in the left  temporal lobe. 3. Compression of the left lateral ventricle with mild dilatation of the temporal horn of the right lateral ventricle. 4. Complex nondisplaced/minimally displaced fractures of the right zygomatic arch, right lateral/inferior orbital wall, right lateral frontal calvarium and left maxillary sinus wall. 5. Bilateral extraconal space orbital contusions with no intraconal pathology. 6. Minimally displaced posterior left first rib fracture. No cervical spine fracture. 7. Mild anterolisthesis at C4-5 and C5-6, favor degenerative etiology.  Critical Value/emergent results were called by telephone at the time of interpretation on 05/15/2015 at 5:49 pm to Dr. Zenovia Jarred , who verbally acknowledged these results.   Electronically Signed   By: Ilona Sorrel M.D.   On: 05/11/2015 18:03    Anti-infectives: Anti-infectives    Start     Dose/Rate Route Frequency Ordered Stop   04/24/15 0300  ceFAZolin (ANCEF) IVPB 2 g/50 mL premix     2 g 100 mL/hr over 30 Minutes Intravenous Every 8 hours 04/26/2015 2034 04/24/15 1859   05/10/2015 1758  ceFAZolin (ANCEF) 2-3 GM-% IVPB SOLR    Comments:  Lani, Mendiola   : cabinet override      05/11/2015 1758 04/28/2015 1831   05/11/2015 1747  ceFAZolin (ANCEF) IVPB 2 g/50 mL premix     2 g 100 mL/hr over 30 Minutes Intravenous 30 min pre-op 04/26/2015 1747 05/19/2015 1908      Assessment/Plan: s/p Procedure(s) with comments: CRANIECTOMY HEMATOMA EVACUATION SUBDURAL (Left) - Craniectomy for subdural hematoma evacuation. Placement of bone flap in abdomen.  S/p fall with TBI  Continue current vent settings and sedation Treat fever Start tube feeds in the next 24 to 48 hours  LOS: 1 day    Timothy Oneill 04/24/2015

## 2015-04-24 NOTE — Progress Notes (Signed)
Patient arrived from OR without C Collar. Dr. Hulen Skains paged.  MD verified C Collar should be placed and maintained. Aspen collar applied.

## 2015-04-24 NOTE — Progress Notes (Signed)
RT and RN transported patient from 3M10 to CT and back to 3G94 with no complications.

## 2015-04-25 ENCOUNTER — Encounter (HOSPITAL_COMMUNITY): Payer: Self-pay | Admitting: Neurosurgery

## 2015-04-25 LAB — POCT I-STAT 7, (LYTES, BLD GAS, ICA,H+H)
ACID-BASE EXCESS: 5 mmol/L — AB (ref 0.0–2.0)
BICARBONATE: 29.4 meq/L — AB (ref 20.0–24.0)
CALCIUM ION: 1.03 mmol/L — AB (ref 1.13–1.30)
HCT: 39 % (ref 39.0–52.0)
Hemoglobin: 13.3 g/dL (ref 13.0–17.0)
O2 SAT: 100 %
PH ART: 7.486 — AB (ref 7.350–7.450)
PO2 ART: 473 mmHg — AB (ref 80.0–100.0)
Patient temperature: 36
Potassium: 3.2 mmol/L — ABNORMAL LOW (ref 3.5–5.1)
SODIUM: 139 mmol/L (ref 135–145)
TCO2: 31 mmol/L (ref 0–100)
pCO2 arterial: 38.6 mmHg (ref 35.0–45.0)

## 2015-04-25 LAB — CBC WITH DIFFERENTIAL/PLATELET
BASOS ABS: 0 10*3/uL (ref 0.0–0.1)
BASOS PCT: 0 %
EOS ABS: 0 10*3/uL (ref 0.0–0.7)
Eosinophils Relative: 0 %
HCT: 30.8 % — ABNORMAL LOW (ref 39.0–52.0)
HEMOGLOBIN: 10.1 g/dL — AB (ref 13.0–17.0)
Lymphocytes Relative: 6 %
Lymphs Abs: 0.8 10*3/uL (ref 0.7–4.0)
MCH: 27.7 pg (ref 26.0–34.0)
MCHC: 32.8 g/dL (ref 30.0–36.0)
MCV: 84.4 fL (ref 78.0–100.0)
MONOS PCT: 6 %
Monocytes Absolute: 0.9 10*3/uL (ref 0.1–1.0)
NEUTROS PCT: 88 %
Neutro Abs: 12.4 10*3/uL — ABNORMAL HIGH (ref 1.7–7.7)
Platelets: 164 10*3/uL (ref 150–400)
RBC: 3.65 MIL/uL — ABNORMAL LOW (ref 4.22–5.81)
RDW: 15.2 % (ref 11.5–15.5)
WBC: 14.1 10*3/uL — ABNORMAL HIGH (ref 4.0–10.5)

## 2015-04-25 LAB — BLOOD GAS, ARTERIAL
Acid-Base Excess: 0.1 mmol/L (ref 0.0–2.0)
BICARBONATE: 22.5 meq/L (ref 20.0–24.0)
DRAWN BY: 249101
FIO2: 0.4
O2 SAT: 98.9 %
PATIENT TEMPERATURE: 100
PCO2 ART: 27.3 mmHg — AB (ref 35.0–45.0)
PEEP: 5 cmH2O
PH ART: 7.53 — AB (ref 7.350–7.450)
PO2 ART: 133 mmHg — AB (ref 80.0–100.0)
RATE: 20 resp/min
TCO2: 23.3 mmol/L (ref 0–100)
VT: 620 mL

## 2015-04-25 LAB — BASIC METABOLIC PANEL
ANION GAP: 12 (ref 5–15)
BUN: 24 mg/dL — ABNORMAL HIGH (ref 6–20)
CALCIUM: 8.2 mg/dL — AB (ref 8.9–10.3)
CO2: 21 mmol/L — ABNORMAL LOW (ref 22–32)
CREATININE: 1.17 mg/dL (ref 0.61–1.24)
Chloride: 106 mmol/L (ref 101–111)
GFR calc non Af Amer: >60 mL/min (ref >60)
Glucose, Bld: 135 mg/dL — ABNORMAL HIGH (ref 65–99)
Potassium: 3.3 mmol/L — ABNORMAL LOW (ref 3.5–5.1)
SODIUM: 139 mmol/L (ref 135–145)

## 2015-04-25 MED ORDER — AMLODIPINE BESYLATE 5 MG PO TABS
5.0000 mg | ORAL_TABLET | Freq: Every day | ORAL | Status: DC
  Administered 2015-04-25 – 2015-04-27 (×3): 5 mg
  Filled 2015-04-25 (×3): qty 1

## 2015-04-25 MED ORDER — AMLODIPINE BESYLATE 5 MG PO TABS: 5.0000 mg | ORAL_TABLET | Freq: Every day | ORAL | Status: DC

## 2015-04-25 MED ORDER — PIVOT 1.5 CAL PO LIQD
1000.0000 mL | ORAL | Status: DC
  Administered 2015-04-25 – 2015-04-26 (×2): 1000 mL
  Filled 2015-04-25 (×2): qty 1000

## 2015-04-25 MED ORDER — ATENOLOL 50 MG PO TABS
150.0000 mg | ORAL_TABLET | Freq: Every day | ORAL | Status: DC
  Administered 2015-04-25 – 2015-04-27 (×3): 150 mg
  Filled 2015-04-25 (×3): qty 3

## 2015-04-25 MED ORDER — ATENOLOL 50 MG PO TABS: 150.0000 mg | ORAL_TABLET | Freq: Every day | ORAL | Status: DC

## 2015-04-25 NOTE — Progress Notes (Signed)
Follow up - Trauma and Critical Care  Patient Details:    Timothy Oneill is an 69 y.o. male.  Lines/tubes : Airway 7.5 mm (Active)  Secured at (cm) 25 cm 04/25/2015  7:52 AM  Measured From Lips 04/25/2015  7:52 AM  Secured Location Right 04/25/2015  7:52 AM  Secured By [employer] 04/25/2015  7:52 AM  Tube Holder Repositioned Yes 04/25/2015  7:52 AM  Cuff Pressure (cm H2O) 26 cm H2O 04/25/2015 12:19 AM  Site Condition Dry 04/25/2015  7:52 AM     Arterial Line 05/22/2015 Right (Active)  Site Assessment Clean;Dry;Intact 04/24/2015  8:00 PM  Line Status Positional 04/24/2015  8:00 PM  Art Line Waveform Whip 04/24/2015  8:00 PM  Art Line Interventions Zeroed and calibrated 04/24/2015  8:00 PM  Color/Movement/Sensation Capillary refill less than 3 sec 04/24/2015  8:00 PM  Dressing Type Transparent 04/24/2015  8:00 PM  Dressing Status Clean;Dry;Intact 04/24/2015  8:00 PM     Closed System Drain 1 Left Other (Comment) Bulb (JP) (Active)  Site Description Unable to view 04/24/2015  8:00 PM  Dressing Status Old drainage 04/24/2015  8:00 PM  Drainage Appearance Bloody 04/24/2015  8:00 PM  Status To suction (Charged) 04/24/2015  8:00 PM  Output (mL) 30 mL 04/25/2015  6:00 AM     NG/OG Tube Orogastric 18 Fr. Center mouth (Active)  Placement Verification Auscultation 04/24/2015  8:00 PM  Site Assessment Clean;Dry;Intact 04/24/2015  8:00 PM  Status Suction-low intermittent 04/24/2015  8:00 PM  Drainage Appearance Bile 04/24/2015  8:00 PM  Output (mL) 200 mL 04/25/2015  6:00 AM     Urethral Catheter debbie hitchcock Temperature probe (Active)  Indication for Insertion or Continuance of Catheter Unstable critical patients (first 24-48 hours) 04/24/2015  8:00 PM  Site Assessment Clean;Intact 04/24/2015  8:00 PM  Catheter Maintenance Bag below level of bladder;Catheter secured;Drainage bag/tubing not touching floor;Insertion date on drainage bag;No dependent loops;Seal intact 04/24/2015  8:00 PM   Collection Container Standard drainage bag 04/24/2015  8:00 PM  Securement Method Securing device (Describe) 04/24/2015  8:00 PM  Urinary Catheter Interventions Unclamped 04/24/2015  8:00 PM  Output (mL) 60 mL 04/25/2015  6:00 AM    Microbiology/Sepsis markers: Results for orders placed or performed during the hospital encounter of 05/11/2015  MRSA PCR Screening     Status: None   Collection Time: 05/14/2015  8:57 PM  Result Value Ref Range Status   MRSA by PCR NEGATIVE NEGATIVE Final    Comment:        The GeneXpert MRSA Assay (FDA approved for NASAL specimens only), is one component of a comprehensive MRSA colonization surveillance program. It is not intended to diagnose MRSA infection nor to guide or monitor treatment for MRSA infections.     Anti-infectives:  Anti-infectives    Start     Dose/Rate Route Frequency Ordered Stop   04/24/15 0300  ceFAZolin (ANCEF) IVPB 2 g/50 mL premix     2 g 100 mL/hr over 30 Minutes Intravenous Every 8 hours 04/28/2015 2034 04/24/15 1037   05/01/2015 1758  ceFAZolin (ANCEF) 2-3 GM-% IVPB SOLR    Comments:  Hadley, Detloff   : cabinet override      05/18/2015 1758 05/12/2015 1831   05/10/2015 1747  ceFAZolin (ANCEF) IVPB 2 g/50 mL premix     2 g 100 mL/hr over 30 Minutes Intravenous 30 min pre-op 05/04/2015 1747 05/06/2015 1908      Best Practice/Protocols:  VTE Prophylaxis: Mechanical GI  Prophylaxis: Proton Pump Inhibitor Continous Sedation just turned off   Consults: Treatment Team:  Earnie Larsson, MD    Events:  Subjective:    Overnight Issues: Patient has not done much.  Objective:  Vital signs for last 24 hours: Temp:  [97.2 F (36.2 C)-101.3 F (38.5 C)] 100 F (37.8 C) (10/03 0700) Pulse Rate:  [46-123] 111 (10/03 0752) Resp:  [6-20] 20 (10/03 0752) BP: (101-173)/(59-120) 138/83 mmHg (10/03 0752) SpO2:  [100 %] 100 % (10/03 0752) Arterial Line BP: (99-174)/(66-144) 135/81 mmHg (10/03 0700) FiO2 (%):  [40 %-50 %] 40 % (10/03  0752)  Hemodynamic parameters for last 24 hours:    Intake/Output from previous day: 10/02 0701 - 10/03 0700 In: 2165.6 [I.V.:1955.6; IV Piggyback:210] Out: 2300 [Urine:1075; Emesis/NG output:700; Drains:525]  Intake/Output this shift: Total I/O In: 165.3 [I.V.:165.3] Out: -   Vent settings for last 24 hours: Vent Mode:  [-] PRVC FiO2 (%):  [40 %-50 %] 40 % Set Rate:  [20 bmp] 20 bmp Vt Set:  [284 mL] 620 mL PEEP:  [5 cmH20] 5 cmH20 Plateau Pressure:  [15 cmH20-16 cmH20] 16 cmH20  Physical Exam:  General: no respiratory distress Neuro: RASS -2 Resp: clear to auscultation bilaterally CVS: IRR, murmur and tachycardic GI: soft, nontender, BS WNL, no r/g Extremities: no edema, no erythema, pulses WNL  No results found for this or any previous visit (from the past 24 hour(s)).   Assessment/Plan:   NEURO  Altered Mental Status:  obtundation and sedation   Plan: Traumatic brain injury.  On sedation  PULM  No specific issues   Plan: CPM  CARDIO  Atrial Fibrillation (with rapid ventricular response)   Plan: Will add home meds which may include a B-blocker  RENAL  Adequate urine output   Plan: Will recheck electrolytes and renal function  GI  No specific issues   Plan: CPM and start tube feedings.  ID  No known infectious problems   Plan: CPM  HEME  Anemia acute blood loss anemia)   Plan: Mild and does not require treatment  ENDO No specific issues   Plan: CPM  Global Issues  The patient is not awake yet, but has only been on Fentanyl 50.  That has recently been cut off.  Will see if he can wean from the ventilator    LOS: 2 days   Additional comments:All labs are pending.  Critical Care Total Time*: 30 Minutes  Timothy Oneill 04/25/2015  *Care during the described time interval was provided by me and/or other providers on the critical care team.  I have reviewed this patient's available data, including medical history, events of note, physical examination and  test results as part of my evaluation.
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
## 2015-04-26 ENCOUNTER — Inpatient Hospital Stay (HOSPITAL_COMMUNITY): Payer: Medicare Other

## 2015-04-26 ENCOUNTER — Encounter (HOSPITAL_COMMUNITY): Payer: Self-pay | Admitting: Radiology

## 2015-04-26 DIAGNOSIS — I6200 Nontraumatic subdural hemorrhage, unspecified: Secondary | ICD-10-CM

## 2015-04-26 DIAGNOSIS — Z515 Encounter for palliative care: Secondary | ICD-10-CM

## 2015-04-26 LAB — BASIC METABOLIC PANEL
Anion gap: 8 (ref 5–15)
BUN: 27 mg/dL — AB (ref 6–20)
CALCIUM: 8.1 mg/dL — AB (ref 8.9–10.3)
CO2: 22 mmol/L (ref 22–32)
CREATININE: 1.14 mg/dL (ref 0.61–1.24)
Chloride: 112 mmol/L — ABNORMAL HIGH (ref 101–111)
GFR calc Af Amer: >60 mL/min (ref >60)
GFR calc non Af Amer: >60 mL/min (ref >60)
GLUCOSE: 138 mg/dL — AB (ref 65–99)
Potassium: 3.3 mmol/L — ABNORMAL LOW (ref 3.5–5.1)
Sodium: 142 mmol/L (ref 135–145)

## 2015-04-26 LAB — CBC WITH DIFFERENTIAL/PLATELET
BASOS PCT: 0 %
Basophils Absolute: 0 10*3/uL (ref 0.0–0.1)
Eosinophils Absolute: 0 10*3/uL (ref 0.0–0.7)
Eosinophils Relative: 0 %
HEMATOCRIT: 30 % — AB (ref 39.0–52.0)
Hemoglobin: 9.8 g/dL — ABNORMAL LOW (ref 13.0–17.0)
LYMPHS PCT: 8 %
Lymphs Abs: 0.9 10*3/uL (ref 0.7–4.0)
MCH: 27.8 pg (ref 26.0–34.0)
MCHC: 32.7 g/dL (ref 30.0–36.0)
MCV: 85.2 fL (ref 78.0–100.0)
MONO ABS: 0.7 10*3/uL (ref 0.1–1.0)
MONOS PCT: 6 %
NEUTROS ABS: 10.7 10*3/uL — AB (ref 1.7–7.7)
Neutrophils Relative %: 86 %
Platelets: 157 10*3/uL (ref 150–400)
RBC: 3.52 MIL/uL — ABNORMAL LOW (ref 4.22–5.81)
RDW: 15.3 % (ref 11.5–15.5)
WBC: 12.3 10*3/uL — ABNORMAL HIGH (ref 4.0–10.5)

## 2015-04-26 LAB — TRIGLYCERIDES: Triglycerides: 87 mg/dL (ref <150)

## 2015-04-26 MED ORDER — IOHEXOL 300 MG/ML  SOLN
100.0000 mL | Freq: Once | INTRAMUSCULAR | Status: AC | PRN
  Administered 2015-04-26: 100 mL via INTRAVENOUS

## 2015-04-26 MED ORDER — POTASSIUM CHLORIDE 10 MEQ/100ML IV SOLN
10.0000 meq | INTRAVENOUS | Status: AC
  Administered 2015-04-26 (×3): 10 meq via INTRAVENOUS
  Filled 2015-04-26 (×5): qty 100

## 2015-04-26 NOTE — Progress Notes (Signed)
Patient ID: Timothy Oneill, male   DOB: 1945/07/28, 69 y.o.   MRN: [id number] Follow up - Trauma and Critical Care  Patient Details:    Timothy Oneill is an 69 y.o. male.  Lines/tubes : Airway 7.5 mm (Active)  Secured at (cm) 25 cm 04/26/2015  3:21 AM  Measured From Lips 04/26/2015  3:21 AM  Secured Location Left 04/26/2015  3:21 AM  Secured By [employer] 04/26/2015  3:21 AM  Tube Holder Repositioned Yes 04/26/2015  3:21 AM  Cuff Pressure (cm H2O) 25 cm H2O 04/26/2015  3:21 AM  Site Condition Dry 04/26/2015  3:21 AM     Closed System Drain 1 Left Other (Comment) Bulb (JP) (Active)  Site Description Unable to view 04/25/2015  8:00 PM  Dressing Status Old drainage 04/25/2015  8:00 PM  Drainage Appearance Bloody 04/25/2015  8:00 PM  Status To suction (Charged) 04/25/2015  8:00 PM  Output (mL) 40 mL 04/26/2015  7:00 AM     NG/OG Tube Orogastric 18 Fr. Center mouth (Active)  Placement Verification Auscultation 04/25/2015  8:00 PM  Site Assessment Clean;Dry;Intact 04/25/2015  8:00 PM  Status Infusing tube feed 04/25/2015  8:00 PM  Drainage Appearance Tan 04/25/2015  4:00 PM  Gastric Residual 5 mL 04/26/2015  4:00 AM  Output (mL) 200 mL 04/25/2015  6:00 AM     Urethral Catheter debbie hitchcock Temperature probe (Active)  Indication for Insertion or Continuance of Catheter Unstable critical patients (first 24-48 hours) 04/25/2015  8:00 PM  Site Assessment Clean;Intact 04/25/2015  8:00 PM  Catheter Maintenance Bag below level of bladder;Catheter secured;Drainage bag/tubing not touching floor;Insertion date on drainage bag;No dependent loops;Seal intact 04/25/2015  8:00 PM  Collection Container Standard drainage bag 04/25/2015  8:00 PM  Securement Method Securing device (Describe) 04/25/2015  8:00 PM  Urinary Catheter Interventions Unclamped 04/25/2015  8:00 AM  Output (mL) 350 mL 04/26/2015  7:00 AM    Microbiology/Sepsis markers: Results for orders placed or performed during the  hospital encounter of 05/09/2015  MRSA PCR Screening     Status: None   Collection Time: 05/09/2015  8:57 PM  Result Value Ref Range Status   MRSA by PCR NEGATIVE NEGATIVE Final    Comment:        The GeneXpert MRSA Assay (FDA approved for NASAL specimens only), is one component of a comprehensive MRSA colonization surveillance program. It is not intended to diagnose MRSA infection nor to guide or monitor treatment for MRSA infections.     Anti-infectives:  Anti-infectives    Start     Dose/Rate Route Frequency Ordered Stop   04/24/15 0300  ceFAZolin (ANCEF) IVPB 2 g/50 mL premix     2 g 100 mL/hr over 30 Minutes Intravenous Every 8 hours 04/26/2015 2034 04/24/15 1037   04/28/2015 1758  ceFAZolin (ANCEF) 2-3 GM-% IVPB SOLR    Comments:  Geraldine, Tesar   : cabinet override      04/26/2015 1758 05/12/2015 1831   05/15/2015 1747  ceFAZolin (ANCEF) IVPB 2 g/50 mL premix     2 g 100 mL/hr over 30 Minutes Intravenous 30 min pre-op 05/22/2015 1747 05/02/2015 1908      Best Practice/Protocols:  VTE Prophylaxis: Mechanical GI Prophylaxis: Proton Pump Inhibitor   Consults: Treatment Team:  Earnie Larsson, MD    Events:  Subjective:    Overnight Issues: No acute changes.  Objective:  Vital signs for last 24 hours: Temp:  [99.3 F (37.4 C)-100.6 F (38.1 C)] 99.9  F (37.7 C) (10/04 0700) Pulse Rate:  [44-111] 107 (10/04 0500) Resp:  [0-21] 20 (10/04 0321) BP: (120-165)/(66-98) 128/81 mmHg (10/04 0700) SpO2:  [100 %] 100 % (10/04 0500) Arterial Line BP: (130-187)/(75-123) 172/81 mmHg (10/03 1500) FiO2 (%):  [30 %-40 %] 30 % (10/04 0321)  Hemodynamic parameters for last 24 hours:    Intake/Output from previous day: 10/03 0701 - 10/04 0700 In: 2331.3 [I.V.:1817; NG/GT:304.3; IV Piggyback:210] Out: 1425 [Urine:1150; Drains:275]  Intake/Output this shift:    Vent settings for last 24 hours: Vent Mode:  [-] PRVC FiO2 (%):  [30 %-40 %] 30 % Set Rate:  [20 bmp] 20 bmp Vt Set:  [443  mL] 620 mL PEEP:  [5 cmH20] 5 cmH20 Plateau Pressure:  [15 cmH20] 15 cmH20  Physical Exam:  General: intubated, sedated Neuro: RASS -2 and GCS 6T, withdraws all extremities to pain HEENT/Neck: ETT WNL  Resp: coarse bilaterally CVS: IRR GI: soft, nontender, BS WNL, no r/g Skin: no rash Extremities: no edema, no erythema, pulses WNL  Results for orders placed or performed during the hospital encounter of 04/25/2015 (from the past 24 hour(s))  Blood gas, arterial     Status: Abnormal   Collection Time: 04/25/15  9:28 AM  Result Value Ref Range   FIO2 0.40    Delivery systems VENTILATOR    Mode PRESSURE REGULATED VOLUME CONTROL    VT 620 mL   LHR 20 resp/min   Peep/cpap 5.0 cm H20   pH, Arterial 7.530 (H) 7.350 - 7.450   pCO2 arterial 27.3 (L) 35.0 - 45.0 mmHg   pO2, Arterial 133 (H) 80.0 - 100.0 mmHg   Bicarbonate 22.5 20.0 - 24.0 mEq/L   TCO2 23.3 0 - 100 mmol/L   Acid-Base Excess 0.1 0.0 - 2.0 mmol/L   O2 Saturation 98.9 %   Patient temperature 100.0    Collection site A-LINE    Drawn by [phone]    Sample type ARTERIAL DRAW    Allens test (pass/fail) PASS PASS  CBC with Differential/Platelet     Status: Abnormal   Collection Time: 04/25/15 11:30 AM  Result Value Ref Range   WBC 14.1 (H) 4.0 - 10.5 K/uL   RBC 3.65 (L) 4.22 - 5.81 MIL/uL   Hemoglobin 10.1 (L) 13.0 - 17.0 g/dL   HCT 30.8 (L) 39.0 - 52.0 %   MCV 84.4 78.0 - 100.0 fL   MCH 27.7 26.0 - 34.0 pg   MCHC 32.8 30.0 - 36.0 g/dL   RDW 15.2 11.5 - 15.5 %   Platelets 164 150 - 400 K/uL   Neutrophils Relative % 88 %   Neutro Abs 12.4 (H) 1.7 - 7.7 K/uL   Lymphocytes Relative 6 %   Lymphs Abs 0.8 0.7 - 4.0 K/uL   Monocytes Relative 6 %   Monocytes Absolute 0.9 0.1 - 1.0 K/uL   Eosinophils Relative 0 %   Eosinophils Absolute 0.0 0.0 - 0.7 K/uL   Basophils Relative 0 %   Basophils Absolute 0.0 0.0 - 0.1 K/uL  Basic metabolic panel     Status: Abnormal   Collection Time: 04/25/15 11:30 AM  Result Value Ref Range    Sodium 139 135 - 145 mmol/L   Potassium 3.3 (L) 3.5 - 5.1 mmol/L   Chloride 106 101 - 111 mmol/L   CO2 21 (L) 22 - 32 mmol/L   Glucose, Bld 135 (H) 65 - 99 mg/dL   BUN 24 (H) 6 - 20 mg/dL   Creatinine, Ser  1.17 0.61 - 1.24 mg/dL   Calcium 8.2 (L) 8.9 - 10.3 mg/dL   GFR calc non Af Amer >60 >60 mL/min   GFR calc Af Amer >60 >60 mL/min   Anion gap 12 5 - 15  CBC with Differential/Platelet     Status: Abnormal   Collection Time: 04/26/15  2:26 AM  Result Value Ref Range   WBC 12.3 (H) 4.0 - 10.5 K/uL   RBC 3.52 (L) 4.22 - 5.81 MIL/uL   Hemoglobin 9.8 (L) 13.0 - 17.0 g/dL   HCT 30.0 (L) 39.0 - 52.0 %   MCV 85.2 78.0 - 100.0 fL   MCH 27.8 26.0 - 34.0 pg   MCHC 32.7 30.0 - 36.0 g/dL   RDW 15.3 11.5 - 15.5 %   Platelets 157 150 - 400 K/uL   Neutrophils Relative % 86 %   Neutro Abs 10.7 (H) 1.7 - 7.7 K/uL   Lymphocytes Relative 8 %   Lymphs Abs 0.9 0.7 - 4.0 K/uL   Monocytes Relative 6 %   Monocytes Absolute 0.7 0.1 - 1.0 K/uL   Eosinophils Relative 0 %   Eosinophils Absolute 0.0 0.0 - 0.7 K/uL   Basophils Relative 0 %   Basophils Absolute 0.0 0.0 - 0.1 K/uL  Basic metabolic panel     Status: Abnormal   Collection Time: 04/26/15  2:26 AM  Result Value Ref Range   Sodium 142 135 - 145 mmol/L   Potassium 3.3 (L) 3.5 - 5.1 mmol/L   Chloride 112 (H) 101 - 111 mmol/L   CO2 22 22 - 32 mmol/L   Glucose, Bld 138 (H) 65 - 99 mg/dL   BUN 27 (H) 6 - 20 mg/dL   Creatinine, Ser 1.14 0.61 - 1.24 mg/dL   Calcium 8.1 (L) 8.9 - 10.3 mg/dL   GFR calc non Af Amer >60 >60 mL/min   GFR calc Af Amer >60 >60 mL/min   Anion gap 8 5 - 15     Assessment/Plan:   NEURO  Trauma-CNS:  intracranial injury   Plan: continue to wean sedation and wean ventilator support  PULM  acute respiratory failure   Plan: continue ventilator support, XR to f/u effusion  CARDIO  hemodynamically stable   Plan: continue support  RENAL  no specific abnormality   Plan: replace electrolytes  GI  no specific  abnormality   Plan: continue enteral nutrition  ID  no acute infections   Plan: monitor for pulmonary infiltrate  HEME  ABLA   Plan: contineu support  ENDO no specific abnormality   Plan: continue support  Global Issues      LOS: 3 days   Additional comments: reviewed XR showing L effusions, will follow, reviewed labs, will replace electrolytes  Critical Care Total Time*: 15 Minutes  Arta Bruce Kinsinger 04/26/2015  *Care during the described time interval was provided by me and/or other providers on the critical care team.  I have reviewed this patient's available data, including medical history, events of note, physical examination and test results as part of my evaluation.

## 2015-04-26 NOTE — Procedures (Signed)
Pt transported to CT from from 2M10 then back to room without complications.
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
## 2015-04-26 NOTE — Progress Notes (Signed)
   04/26/15 1000  Clinical Encounter Type  Visited With Family  Visit Type Other (Comment)  Referral From Nurse  Stress Factors  Family Stress Factors Health changes  CHaplain visited with familly of the Pt; Pt family said they did not need a chaplain

## 2015-04-27 ENCOUNTER — Inpatient Hospital Stay (HOSPITAL_COMMUNITY): Payer: Medicare Other

## 2015-04-27 DIAGNOSIS — Z515 Encounter for palliative care: Secondary | ICD-10-CM

## 2015-04-27 LAB — POCT I-STAT 3, ART BLOOD GAS (G3+)
BICARBONATE: 22.1 meq/L (ref 20.0–24.0)
O2 SAT: 92 %
PCO2 ART: 29.1 mmHg — AB (ref 35.0–45.0)
Patient temperature: 100.4
TCO2: 23 mmol/L (ref 0–100)
pH, Arterial: 7.493 — ABNORMAL HIGH (ref 7.350–7.450)
pO2, Arterial: 61 mmHg — ABNORMAL LOW (ref 80.0–100.0)

## 2015-04-27 LAB — TYPE AND SCREEN
ABO/RH(D): O POS
Antibody Screen: NEGATIVE
UNIT DIVISION: 0
UNIT DIVISION: 0
UNIT DIVISION: 0
Unit division: 0

## 2015-04-27 LAB — BASIC METABOLIC PANEL
ANION GAP: 10 (ref 5–15)
BUN: 28 mg/dL — ABNORMAL HIGH (ref 6–20)
CALCIUM: 8.1 mg/dL — AB (ref 8.9–10.3)
CO2: 22 mmol/L (ref 22–32)
Chloride: 110 mmol/L (ref 101–111)
Creatinine, Ser: 0.95 mg/dL (ref 0.61–1.24)
GFR calc Af Amer: >60 mL/min (ref >60)
GFR calc non Af Amer: >60 mL/min (ref >60)
GLUCOSE: 125 mg/dL — AB (ref 65–99)
Potassium: 3.3 mmol/L — ABNORMAL LOW (ref 3.5–5.1)
Sodium: 142 mmol/L (ref 135–145)

## 2015-04-27 LAB — CBC
HEMATOCRIT: 29.2 % — AB (ref 39.0–52.0)
Hemoglobin: 9.7 g/dL — ABNORMAL LOW (ref 13.0–17.0)
MCH: 28.4 pg (ref 26.0–34.0)
MCHC: 33.2 g/dL (ref 30.0–36.0)
MCV: 85.4 fL (ref 78.0–100.0)
Platelets: 185 10*3/uL (ref 150–400)
RBC: 3.42 MIL/uL — ABNORMAL LOW (ref 4.22–5.81)
RDW: 15.1 % (ref 11.5–15.5)
WBC: 9.9 10*3/uL (ref 4.0–10.5)

## 2015-04-27 LAB — BLOOD PRODUCT ORDER (VERBAL) VERIFICATION

## 2015-04-27 MED ORDER — MORPHINE BOLUS VIA INFUSION
5.0000 mg | INTRAVENOUS | Status: DC | PRN
  Filled 2015-04-27: qty 20

## 2015-04-27 MED ORDER — FENTANYL BOLUS VIA INFUSION
25.0000 ug | INTRAVENOUS | Status: DC | PRN
  Filled 2015-04-27: qty 50

## 2015-04-27 MED ORDER — FENTANYL BOLUS VIA INFUSION
50.0000 ug | INTRAVENOUS | Status: DC | PRN
  Filled 2015-04-27: qty 100

## 2015-04-27 MED ORDER — DEXMEDETOMIDINE HCL IN NACL 200 MCG/50ML IV SOLN: 0.2000 ug/kg/h | INTRAVENOUS | Status: DC

## 2015-04-27 MED ORDER — LORAZEPAM 2 MG/ML IJ SOLN
INTRAMUSCULAR | Status: AC
  Filled 2015-04-27: qty 1

## 2015-04-27 MED ORDER — LORAZEPAM 2 MG/ML IJ SOLN: 1.0000 mg | Freq: Once | INTRAMUSCULAR | Status: AC

## 2015-04-27 MED ORDER — MORPHINE SULFATE 25 MG/ML IV SOLN
10.0000 mg/h | INTRAVENOUS | Status: DC
  Filled 2015-04-27: qty 10

## 2015-04-27 MED ORDER — PIVOT 1.5 CAL PO LIQD
1000.0000 mL | ORAL | Status: DC
Start: 2015-04-27 — End: 2015-04-27
  Filled 2015-04-27 (×2): qty 1000

## 2015-04-27 MED ORDER — LORAZEPAM 2 MG/ML IJ SOLN: 1.0000 mg | INTRAMUSCULAR | Status: DC | PRN

## 2015-04-27 MED ORDER — LORAZEPAM 2 MG/ML IJ SOLN
2.0000 mg | INTRAMUSCULAR | Status: DC | PRN
Start: 2015-04-27 — End: 2015-04-27
  Administered 2015-04-27: 2 mg via INTRAVENOUS

## 2015-04-27 MED ORDER — GLYCOPYRROLATE 0.2 MG/ML IJ SOLN: 0.4000 mg | INTRAMUSCULAR | Status: DC

## 2015-05-11 DIAGNOSIS — W19XXXA Unspecified fall, initial encounter: Secondary | ICD-10-CM | POA: Diagnosis present

## 2015-05-11 DIAGNOSIS — J9600 Acute respiratory failure, unspecified whether with hypoxia or hypercapnia: Secondary | ICD-10-CM | POA: Diagnosis present

## 2015-05-11 DIAGNOSIS — S066X9A Traumatic subarachnoid hemorrhage with loss of consciousness of unspecified duration, initial encounter: Secondary | ICD-10-CM | POA: Diagnosis present

## 2015-05-11 DIAGNOSIS — S0292XA Unspecified fracture of facial bones, initial encounter for closed fracture: Secondary | ICD-10-CM | POA: Diagnosis present

## 2015-05-11 DIAGNOSIS — S2232XA Fracture of one rib, left side, initial encounter for closed fracture: Secondary | ICD-10-CM | POA: Diagnosis present

## 2015-05-11 DIAGNOSIS — S066XAA Traumatic subarachnoid hemorrhage with loss of consciousness status unknown, initial encounter: Secondary | ICD-10-CM | POA: Diagnosis present
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
## 2015-05-24 NOTE — Consult Note (Signed)
Consultation Note Date: 2015/04/29   Patient Name: Timothy Oneill  DOB: 01/25/1946  MRN: [id number]  Age / Sex: 69 y.o., male   PCP: No primary care provider on file. Referring Physician: Trauma Md, MD  Reason for Consultation: Establishing goals of care and Terminal care  Palliative Care Assessment and Plan Summary of Established Goals of Care and Medical Treatment Preferences    Palliative Care Discussion Held Today:   Timothy Oneill is lying in bed sedated on vent and surrounded by his wife, daughter, and son at bedside. They tell me that they have decided and are prepared to transition to comfort care and remove the ventilator. We discussed the process and expectations of what this may look like for him. Wife requests a priest for anointing as he was raised Catholic but they felt this was not priority as Catholicism was not a part of his life and they did not wish to wait for extubation understanding that he may die before a priest may come to bedside - they are okay with this and wish to proceed (I did request from spiritual care for priest to come). Fentanyl bolus and ativan bolus given prior to extubation as well. He was tolerating PS 5/5 without labored breathing with RR ~20-25 prior to extubation. Discussed plan with RN and RT and proceeded with extubation. RDOS 5 post extubation. Emotional support provided to family and staff throughout entire process. Continued to provide guidance for nursing for aggressive symptom management requiring titration and frequent bolus of fentanyl to achieve RDOS 3.    Contacts/Participants in Discussion: Primary Decision Maker: Wife   Goals of Care/Code Status/Advance Care Planning:   Code Status: DNR  Comfort care   Symptom Management:   Pain/Dyspnea: Fentanyl gtt at 175 mcg/hr with bolus every 15 min prn 50-100 mcg.   Anxiety/moaning: Ativan 2 mg every hour prn.   Secretions: Robinul 0.4 mg every 4 hours.   Psycho-social/Spiritual:    Support System: Family supportive and at bedside.   Desire for further Chaplaincy support: yes  Prognosis: Hours  Discharge Planning:  Anticipate hospital death       Chief Complaint/HPI: 69 yo male with PMH atrial fibrillation presented after fall from [employer] truck 05/05/2015 while moving from [place] to [place]. Went to OR for craniectomy hematoma evacuation for large left sided counter coup SDH. Post op continued to worsen with intracranial hemorrhage and edema.    Primary Diagnoses  Present on Admission:  . Subdural hematoma ([place])  Palliative Review of Systems:   Unable to assess - unresponsive   I have reviewed the medical record, interviewed the patient and family, and examined the patient. The following aspects are pertinent.  Past Medical History  Diagnosis Date  . Renal disorder   . Hypertension   . Cancer [facility])     prostate  . Anticoagulated     Unknown.  According to document accompanying patient, the box is checked yes and states "take blood thinners"   Social History   Social History  . Marital Status: Unknown    Spouse Name: N/A  . Number of Children: N/A  . Years of Education: N/A   Social History Main Topics  . Smoking status: Former Smoker    Types: Cigarettes  . Smokeless tobacco: None  . Alcohol Use: None  . Drug Use: None  . Sexual Activity: Not Asked   Other Topics Concern  . None   Social History Narrative   No family history on file. Scheduled Meds: .  antiseptic oral rinse  7 mL Mouth Rinse QID  . chlorhexidine gluconate  15 mL Mouth Rinse BID  . fentaNYL (SUBLIMAZE) injection  50 mcg Intravenous Once  . glycopyrrolate  0.4 mg Intravenous Q4H  . levETIRAcetam  500 mg Intravenous Q12H  . LORazepam       Continuous Infusions: . fentaNYL infusion INTRAVENOUS 175 mcg/hr (May 18, 2015 1424)   PRN Meds:.acetaminophen, bisacodyl, fentaNYL, LORazepam, metoprolol, [DISCONTINUED] ondansetron **OR** ondansetron (ZOFRAN) IV Medications  Prior to Admission:  Prior to Admission medications   Medication Sig Start Date End Date Taking? Authorizing Provider  amLODipine (NORVASC) 5 MG tablet Take 5 mg by mouth daily.   Yes Historical Provider, MD  atenolol (TENORMIN) 50 MG tablet Take 150 mg by mouth daily.   Yes Historical Provider, MD  atorvastatin (LIPITOR) 40 MG tablet Take 40 mg by mouth daily.   Yes Historical Provider, MD  Cholecalciferol (VITAMIN D3) 5000 UNITS CAPS Take 1 capsule by mouth daily.   Yes Historical Provider, MD  hydrochlorothiazide (HYDRODIURIL) 25 MG tablet Take 25 mg by mouth daily.   Yes Historical Provider, MD  HYDROcodone-acetaminophen (NORCO) 10-325 MG tablet Take 1 tablet by mouth 3 (three) times daily.   Yes Historical Provider, MD  levothyroxine (SYNTHROID, LEVOTHROID) 175 MCG tablet Take 175 mcg by mouth daily before breakfast.   Yes Historical Provider, MD  magnesium oxide (MAG-OX) 400 MG tablet Take 400 mg by mouth 2 (two) times daily.   Yes Historical Provider, MD  morphine (MSIR) 30 MG tablet Take 30 mg by mouth 3 (three) times daily.   Yes Historical Provider, MD   Allergies  Allergen Reactions  . Lisinopril Other (See Comments)    Unknown.  From [facility] personal health history form accompanying patient.    CBC:    Component Value Date/Time   WBC 9.9 May 18, 2015 0248   HGB 9.7* 05-18-2015 0248   HCT 29.2* 18-May-2015 0248   PLT 185 05-18-15 0248   MCV 85.4 05/18/2015 0248   NEUTROABS 10.7* 04/26/2015 0226   LYMPHSABS 0.9 04/26/2015 0226   MONOABS 0.7 04/26/2015 0226   EOSABS 0.0 04/26/2015 0226   BASOSABS 0.0 04/26/2015 0226   Comprehensive Metabolic Panel:    Component Value Date/Time   NA 142 18-May-2015 0727   K 3.3* 18-May-2015 0727   CL 110 2015-05-18 0727   CO2 22 05-18-2015 0727   BUN 28* 05-18-15 0727   CREATININE 0.95 May 18, 2015 0727   GLUCOSE 125* May 18, 2015 0727   CALCIUM 8.1* 05-18-2015 0727   AST 39 04/24/2015 1645   ALT 11* 05/03/2015 1645    ALKPHOS 66 05/22/2015 1645   BILITOT 1.8* 05/05/2015 1645   PROT 6.4* 05/06/2015 1645   ALBUMIN 4.0 05/03/2015 1645    Physical Exam:  Vital Signs: BP 104/56 mmHg  Pulse 67  Temp(Src) 100 F (37.8 C) (Core (Comment))  Resp 21  Ht 6\' 2"  (1.88 m)  Wt 99.791 kg (220 lb)  BMI 28.23 kg/m2  SpO2 100% SpO2: SpO2: 100 % O2 Device: O2 Device: Ventilator O2 Flow Rate:   Intake/output summary:  Intake/Output Summary (Last 24 hours) at 05/18/15 1436 Last data filed at 2015/05/18 1300  Gross per 24 hour  Intake 2537.83 ml  Output    988 ml  Net 1549.83 ml   LBM: Last BM Date:  (Prior to Admission) Baseline Weight: Weight: 99.791 kg (220 lb) Most recent weight: Weight: 99.791 kg (220 lb)  Exam Findings:  General: Sedated on vent HEENT: Bruising and  trauma evidenced CVS: Irreg Resp: No labored breathing, mechanical ventilation Abd: Soft, ND Extrem: Warm to touch Neuro: Unresponsive, sedation also given           Palliative Performance Scale: 10 %                Additional Data Reviewed: Recent Labs     04/26/15  0226  2015/05/23  0248  05/23/15  0727  WBC  12.3*  9.9   --   HGB  9.8*  9.7*   --   PLT  157  185   --   NA  142   --   142  BUN  27*   --   28*  CREATININE  1.14   --   0.95     Time In: 1330 Time Out: 1450 Time Total: 75min  Greater than 50%  of this time was spent counseling and coordinating care related to the above assessment and plan.   Signed by:  Vinie Sill, NP Palliative Medicine Team Pager # [phone] (M-F 8a-5p) Team Phone # [phone] (Nights/Weekends)

## 2015-05-24 NOTE — Progress Notes (Signed)
Time of death: 1452. Confirmed by ascultation of the heart with Glenford Peers and Roxan Diesel. Dr. Hulen Skains notified and made aware.

## 2015-05-24 NOTE — Progress Notes (Signed)
No significant change in status. I've had further conversations with the family this morning. They do not wish to proceed with continued aggressive care. They wish to pursue withdrawal of ventilatory support and a move to palliative care. I believe this is appropriate. We will consult the palliative medicine service. Plan for extubation later today.

## 2015-05-24 NOTE — Progress Notes (Signed)
Pt with no significant change in medical status.  Family has chosen to offer comfort care.  Plan terminal extubation later today.    Reinaldo Raddle, RN, BSN  Trauma/Neuro ICU Case Manager [phone]

## 2015-05-24 NOTE — Progress Notes (Signed)
   05/07/15 1400  Clinical Encounter Type  Visited With Health care provider  Visit Type Patient actively dying  Referral From Palliative care team  Spiritual Encounters  Spiritual Needs Other (Comment) (Notified that [place] priest contacted for anointing )

## 2015-05-24 NOTE — Progress Notes (Signed)
Patient extubated. Continued on Fentanyl. Ativan and Fentanyl boluses given PRN to make patient comfortable.  Family at bedside and has spoken with palliative NP. Will continue to monitor and comfort family.

## 2015-05-24 NOTE — Progress Notes (Signed)
Nutrition Brief Note  Chart reviewed. Pt now transitioning to comfort care.  No further nutrition interventions warranted at this time.  Please re-consult as needed.   Icelyn Navarrete RD, LDN, CNSC [phone] Pager [phone] After Hours Pager    

## 2015-05-24 NOTE — Progress Notes (Signed)
CT head from yesterday looks devastatingly bad.  He is not following any commands, but he is sedated.  Jerks hisupper extremities when painfully stimulated.  CXR shows RLL infiltrate starting to emerge.  Pupils are pinpoint and difficult to see if the react.Toleratign tube feedings.  Goals of care need to be discussed with the family.  I believe this was approached by the neurosurgeon yesterday.  ABG is showing significant alkalosis for hyperventilation.  Will adjust the ventilator.  Kathryne Eriksson. Dahlia Bailiff, MD, [place] [phone] Trauma Surgeon

## 2015-05-24 NOTE — Procedures (Signed)
Extubation Procedure Note  Patient Details:   Name: [name] DOB: [birth year] MRN: [id number]   Airway Documentation:     Evaluation  O2 sats: transiently fell during during procedure Complications: No apparent complications Patient did not tolerate procedure well. Bilateral Breath Sounds: Clear, Diminished Suctioning: Airway No   Patient extubated to a 2L [place] per end of life withdrawal protocol. Pallative care NP, RN and family were at bedside during extubation. RT will continue to monitor.  [name] 2015-05-21, 2:31 PM

## 2015-05-24 DEATH — deceased

## 2016-02-23 IMAGING — CR DG CHEST 1V PORT
1 series · 1 of 1 positions shown · non-contrast
Comparison: Portable chest x-ray April 26, 2015

CLINICAL DATA: Respiratory failure

EXAM:
PORTABLE CHEST 1 VIEW

[AP]
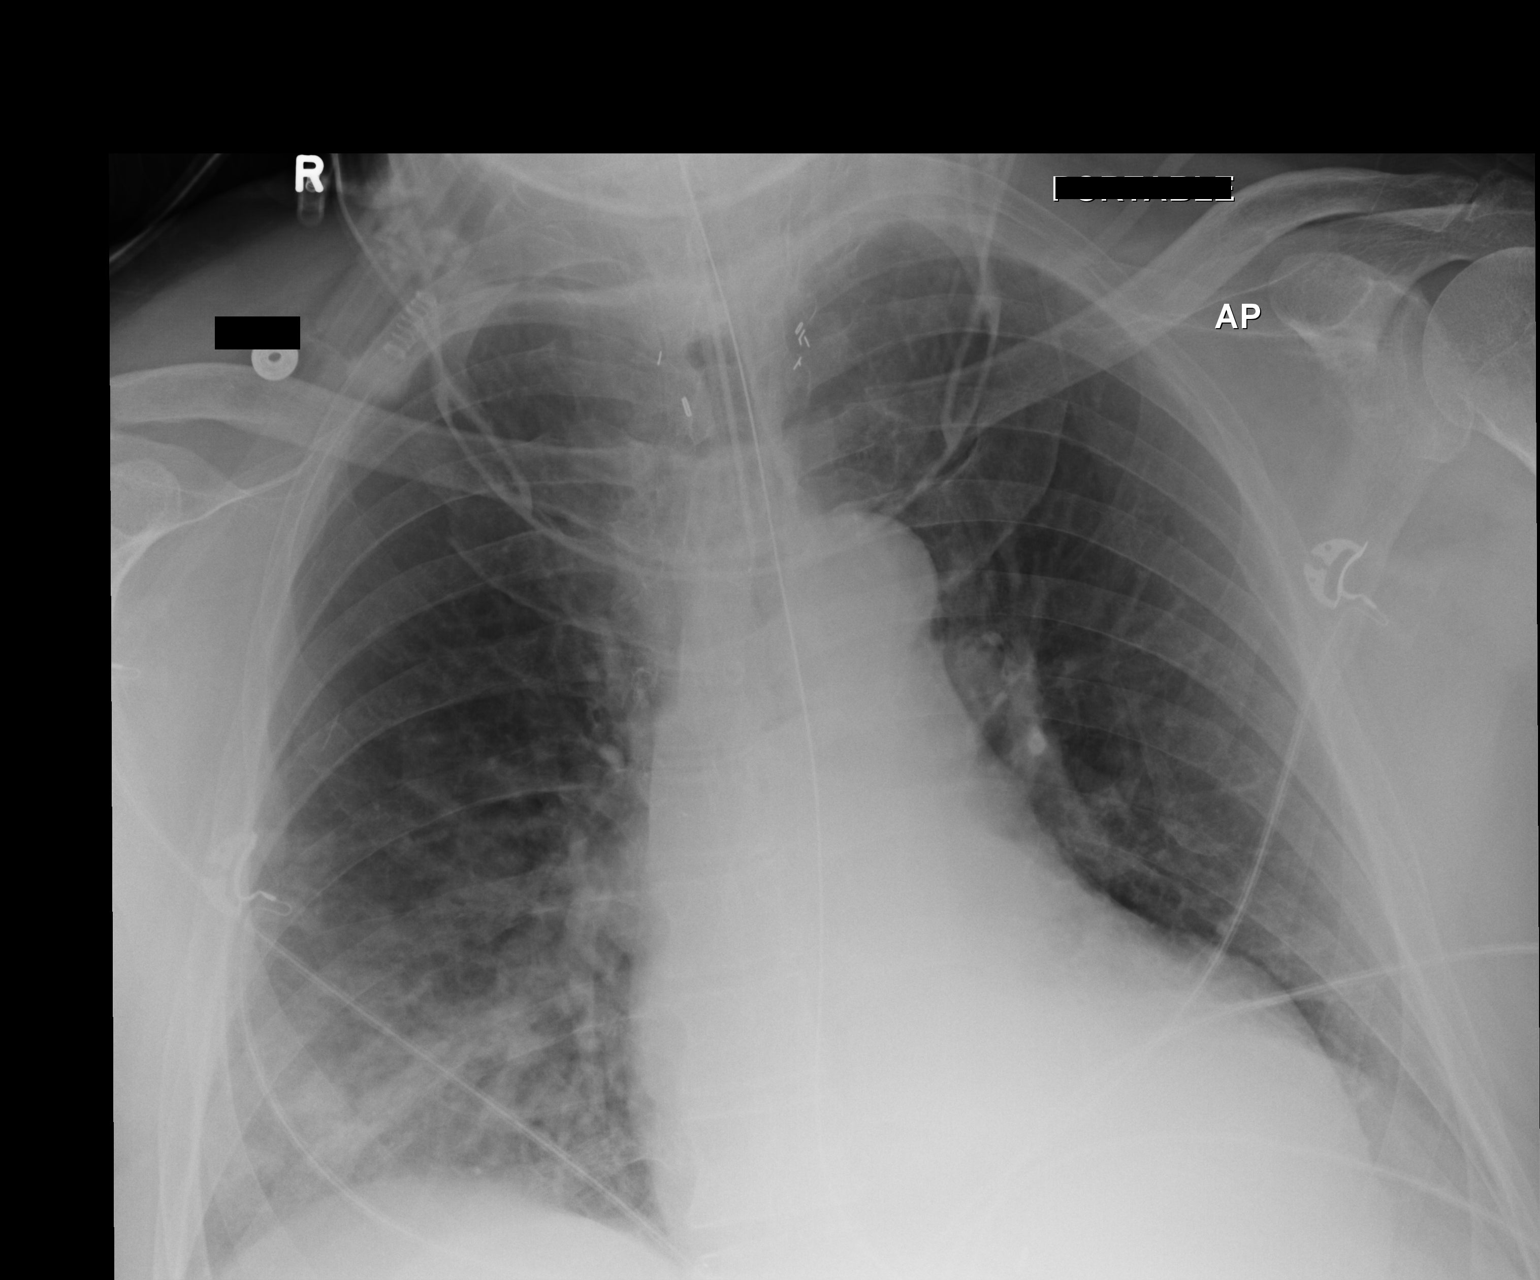

[1 of 1 positions shown; findings below may reference images not displayed]

FINDINGS: The lungs are adequately inflated. There has been interval
development of right lower lobe atelectasis or pneumonia. The left
hemidiaphragm is excluded from the field of view but was noted to be
obscured yesterday. The cardiac silhouette is mildly enlarged. The
pulmonary vascularity is mildly prominent. The endotracheal tube tip
lies 5.4 cm above the carina. The esophagogastric tube tip projects
below the inferior margin of the image.
IMPRESSION: Interval development of right lower lobe atelectasis or pneumonia.
Stable increased density in the retrocardiac region on the left.
Stable cardiomegaly with mild central pulmonary vascular prominence.
The endotracheal and esophagogastric tubes are in reasonable
position.
